# Patient Record
Sex: Male | Born: 1980 | Race: White | Hispanic: No | Marital: Married | Smoking: Never smoker
Health system: Southern US, Community
[De-identification: ages and names within clinical notes are randomized; demographics above are authoritative.]

## PROBLEM LIST (undated history)

## (undated) DIAGNOSIS — G510 Bell's palsy: Principal | ICD-10-CM

## (undated) HISTORY — DX: Bell's palsy: G51.0

## (undated) HISTORY — PX: WISDOM TOOTH EXTRACTION: SHX21

---

## 2013-11-06 ENCOUNTER — Emergency Department (HOSPITAL_BASED_OUTPATIENT_CLINIC_OR_DEPARTMENT_OTHER)
Admission: EM | Admit: 2013-11-06 | Discharge: 2013-11-06 | Disposition: A | Discharge status: Home / Self Care | Payer: BC Managed Care – PPO | Attending: Emergency Medicine | Admitting: Emergency Medicine

## 2013-11-06 ENCOUNTER — Encounter (HOSPITAL_BASED_OUTPATIENT_CLINIC_OR_DEPARTMENT_OTHER): Payer: Self-pay | Admitting: Emergency Medicine

## 2013-11-06 DIAGNOSIS — J3489 Other specified disorders of nose and nasal sinuses: Secondary | ICD-10-CM | POA: Insufficient documentation

## 2013-11-06 DIAGNOSIS — IMO0001 Reserved for inherently not codable concepts without codable children: Secondary | ICD-10-CM | POA: Insufficient documentation

## 2013-11-06 DIAGNOSIS — G510 Bell's palsy: Secondary | ICD-10-CM | POA: Insufficient documentation

## 2013-11-06 DIAGNOSIS — R209 Unspecified disturbances of skin sensation: Secondary | ICD-10-CM | POA: Insufficient documentation

## 2013-11-06 MED ORDER — PREDNISONE 10 MG PO TABS: ORAL_TABLET | ORAL | Status: DC

## 2013-11-06 MED ORDER — VALACYCLOVIR HCL 1 G PO TABS: 1000.0000 mg | ORAL_TABLET | Freq: Three times a day (TID) | ORAL | Status: DC

## 2013-11-06 MED ORDER — HYDROCODONE-ACETAMINOPHEN 5-325 MG PO TABS: 2.0000 | ORAL_TABLET | ORAL | Status: DC | PRN

## 2013-11-06 NOTE — ED Notes (Addendum)
Pt states he has right sided temporal headache for two days. Worse with dependant and laying down.  Pt has numbness to right side of face and tongue.  Has difficulty raising right eyebrow and closing right eye as well as dryness.  No other neuro symptoms noted.  Pt was seen at urgent care prior to coming here.

## 2013-11-06 NOTE — Discharge Instructions (Signed)
Take the medication as directed. Do not take the narcotic if you are driving as it will make you sleepy. Follow up with your doctor. Return here as needed.  Bell's Palsy Bell's palsy is a condition in which the muscles on one side of the face cannot move (paralysis). This is because the nerves in the face are paralyzed. It is most often thought to be caused by a virus. The virus causes swelling of the nerve that controls movement on one side of the face. The nerve travels through a tight space surrounded by bone. When the nerve swells, it can be compressed by the bone. This results in damage to the protective covering around the nerve. This damage interferes with how the nerve communicates with the muscles of the face. As a result, it can cause weakness or paralysis of the facial muscles.  Injury (trauma), tumor, and surgery may cause Bell's palsy, but most of the time the cause is unknown. It is a relatively common condition. It starts suddenly (abrupt onset) with the paralysis usually ending within 2 days. Bell's palsy is not dangerous. But because the eye does not close properly, you may need care to keep the eye from getting dry. This can include splinting (to keep the eye shut) or moistening with artificial tears. Bell's palsy very seldom occurs on both sides of the face at the same time. SYMPTOMS   Eyebrow sagging.  Drooping of the eyelid and corner of the mouth.  Inability to close one eye.  Loss of taste on the front of the tongue.  Sensitivity to loud noises. TREATMENT  The treatment is usually non-surgical. If the patient is seen within the first 24 to 48 hours, a short course of steroids may be prescribed, in an attempt to shorten the length of the condition. Antiviral medicines may also be used with the steroids, but it is unclear if they are helpful.  You will need to protect your eye, if you cannot close it. The cornea (clear covering over your eye) will become dry and can be damaged.  Artificial tears can be used to keep your eye moist. Glasses or an eye patch should be worn to protect your eye. PROGNOSIS  Recovery is variable, ranging from days to months. Although the problem usually goes away completely (about 80% of cases resolve), predicting the outcome is impossible. Most people improve within 3 weeks of when the symptoms began. Improvement may continue for 3 to 6 months. A small number of people have moderate to severe weakness that is permanent.  HOME CARE INSTRUCTIONS   If your caregiver prescribed medication to reduce swelling in the nerve, use as directed. Do not stop taking the medication unless directed by your caregiver.  Use moisturizing eye drops as needed to prevent drying of your eye, as directed by your caregiver.  Protect your eye, as directed by your caregiver.  Use facial massage and exercises, as directed by your caregiver.  Perform your normal activities, and get your normal rest. SEEK IMMEDIATE MEDICAL CARE IF:   There is pain, redness or irritation in the eye.  You or your child has an oral temperature above 102 F (38.9 C), not controlled by medicine. MAKE SURE YOU:   Understand these instructions.  Will watch your condition.  Will get help right away if you are not doing well or get worse. Document Released: 04/23/2005 Document Revised: 07/16/2011 Document Reviewed: 05/02/2009 [facility] Patient Information 2015 [place], [place]. This information is not intended to replace advice  given to you by your health care provider. Make sure you discuss any questions you have with your health care provider.

## 2013-11-06 NOTE — ED Provider Notes (Signed)
CSN: [id number]     Arrival date & time 11/06/13  1135 History   First MD Initiated Contact with Patient 11/06/13 1219     Chief Complaint  Patient presents with  . Headache  . Numbness     (Consider location/radiation/quality/duration/timing/severity/associated sxs/prior Treatment) Patient is a 33 y.o. male presenting with headaches. The history is provided by the patient.  Headache Pain location:  R temporal and R parietal Radiates to:  Face Severity currently:  6/10 Severity at highest:  10/10 Onset quality:  Gradual Duration:  2 days Timing:  Constant Chronicity:  New Similar to prior headaches: no   Relieved by:  Nothing Associated symptoms: congestion and myalgias   Associated symptoms: no abdominal pain, no back pain, no diarrhea, no fever, no nausea, no sinus pressure, no sore throat and no vomiting    Herbert SpiresScott Burich is a 33 y.o. male who presents to the ED with headache that started 2 days ago. He took OTC medications that helped some but never completely went away. The headache is located on the right side of his head. This morning he developed right side of face was numb and his eyebrows did not match. The right side is lower than the left and he can not close his right eye completely. The symptoms are limited to the right side of his face and felling different when he swallows. He rates his headache as a 6/10 which is less than it was 2 days ago when it was 10/10. Denies numbness but does have tingling in the right side of his mouth.   No past medical history on file. No past surgical history on file. No family history on file. History  Substance Use Topics  . Smoking status: Never Smoker   . Smokeless tobacco: Not on file  . Alcohol Use: Not on file    Review of Systems  Constitutional: Negative for fever and chills.  HENT: Positive for congestion. Negative for sinus pressure and sore throat.   Gastrointestinal: Negative for nausea, vomiting, abdominal pain and  diarrhea.  Genitourinary: Negative for dysuria, urgency and frequency.  Musculoskeletal: Positive for myalgias. Negative for back pain and gait problem.  Skin: Negative for rash.  Neurological: Positive for headaches. Negative for syncope.  Psychiatric/Behavioral: Negative for confusion. The patient is not nervous/anxious.       Allergies  Review of patient's allergies indicates no known allergies.  Home Medications   Prior to Admission medications   Not on File   BP 124/69  Pulse 61  Temp(Src) 98.4 F (36.9 C)  Resp 16  Ht 6' (1.829 m)  Wt 200 lb (90.719 kg)  BMI 27.12 kg/m2  SpO2 100% Physical Exam  Constitutional: He is oriented to person, place, and time. He appears well-developed and well-nourished. No distress.  HENT:  Head: Normocephalic and atraumatic.  Right Ear: Tympanic membrane normal.  Left Ear: Tympanic membrane normal.  Nose: Nose normal.  Mouth/Throat: Uvula is midline, oropharynx is clear and moist and mucous membranes are normal.  Eyes: Conjunctivae and EOM are normal. Pupils are equal, round, and reactive to light.  Neck: Normal range of motion. Neck supple.  Cardiovascular: Normal rate, regular rhythm and normal heart sounds.   Pulmonary/Chest: Effort normal. He has no wheezes. He has no rales.  Musculoskeletal: Normal range of motion. He exhibits no edema.  Radial and pedal pulses strong, adequate circulation, good touch sensation.  Neurological: He is alert and oriented to person, place, and time. 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 He displays a negative Romberg sign. Gait normal.  Reflex Scores:      Bicep reflexes are 2+ on the right side and 2+ on the left side.      Brachioradialis reflexes are 2+ on the right side and 2+ on the left side.      Patellar reflexes are 2+ on the right side and 2+ on the left side.      Achilles reflexes are 2+ on the right side and 2+ on the left side. Rapid alternating movement  without difficulty. Stands on one foot without difficulty. Unable to closed right eye completely, right eyebrow lower than left. Slight droop of right side of mouth.   Skin: Skin is warm and dry.  Psychiatric: He has a normal mood and affect. His behavior is normal.    ED Course  Procedures   MDM  33 y.o. male with right side facial droop. Stable for discharge without concern for CVA at this time. Will treat for Bell's Palsy. Discussed with the patient and all questioned fully answered. He will return if any problems arise.    Medication List         HYDROcodone-acetaminophen 5-325 MG per tablet  Commonly known as:  NORCO/VICODIN  Take 2 tablets by mouth every 4 (four) hours as needed.     predniSONE 10 MG tablet  Commonly known as:  DELTASONE  Take 6 tablets today then 5, 4, 3, 2, 1     valACYclovir 1000 MG tablet  Commonly known as:  VALTREX  Take 1 tablet (1,000 mg total) by mouth 3 (three) times daily.           [facility] Orlene OchM Vandy Fong, [place]NP 11/07/13 [phone]

## 2013-11-08 NOTE — ED Provider Notes (Signed)
Medical screening examination/treatment/procedure(s) were performed by non-physician practitioner and as supervising physician I was immediately available for consultation/collaboration.   EKG Interpretation None        Joshua SproutWhitney Jojuan Champney, MD 11/08/13 1158

## 2013-11-17 ENCOUNTER — Ambulatory Visit (INDEPENDENT_AMBULATORY_CARE_PROVIDER_SITE_OTHER): Payer: BC Managed Care – PPO | Admitting: Neurology

## 2013-11-17 ENCOUNTER — Encounter: Payer: Self-pay | Admitting: Neurology

## 2013-11-17 VITALS — BP 127/60 | HR 67 | Ht 72.0 in | Wt 201.0 lb

## 2013-11-17 DIAGNOSIS — G510 Bell's palsy: Secondary | ICD-10-CM

## 2013-11-17 HISTORY — DX: Bell's palsy: G51.0

## 2013-11-17 NOTE — Progress Notes (Signed)
Reason for visit: Bell's palsy  Joshua SpiresScott Underwood is a 33 y.o. male  History of present illness:  Joshua Underwood is a 33 year old right-handed white male with a history of onset of Bell's palsy on 11/06/2013. The patient reported onset of headache 2 days prior that involved the right occipital area and around the right ear. The patient also has some discomfort going down into the right neck. The patient was seen through the emergency room and he was placed on a prednisone taper and Valtrex. The prednisone seemed to help the headache, but as the taper off of prednisone continued, the headache ensued, and his right facial weakness got worse. The patient was seen by an ENT physician, and he was placed back on a prednisone taper, and this once again his helped the headache. The patient has severe right facial weakness at this point. He is having to tape his eyelid shut at night, and he uses artificial tears during the day. The patient denies any numbness or weakness on the arms or legs. There is no numbness on the face. The patient denies any double vision or loss of vision, but his right eye is somewhat blurry with the eyedrops. He does have alteration in taste with a metallic taste on the right side of tongue. He has hyperacusis with the right ear. He denies any balance issues. He comes to this office for an evaluation.  Past Medical History  Diagnosis Date  . Bell's palsy 11/17/2013    right    History reviewed. No pertinent past surgical history.  Family History  Problem Relation Age of Onset  . Diabetes Maternal Grandmother   . Kidney disease Maternal Grandfather   . Diabetes Paternal Grandmother     Social history:  reports that he has never smoked. He has never used smokeless tobacco. He reports that he does not drink alcohol or use illicit drugs.  Medications:  Current Outpatient Prescriptions on File Prior to Visit  Medication Sig Dispense Refill  . HYDROcodone-acetaminophen  (NORCO/VICODIN) 5-325 MG per tablet Take 2 tablets by mouth every 4 (four) hours as needed.  10 tablet  0   No current facility-administered medications on file prior to visit.     No Known Allergies  ROS:  Out of a complete 14 system review of symptoms, the patient complains only of the following symptoms, and all other reviewed systems are negative.  Fatigue Ringing in the ears Blurred vision, eye pain Headache, numbness, weakness Decreased energy  Blood pressure 127/60, pulse 67, height 6' (1.829 m), weight 201 lb (91.173 kg).  Physical Exam  General: The patient is alert and cooperative at the time of the examination.  Eyes: Pupils are equal, round, and reactive to light. Discs are flat bilaterally.  Neck: The neck is supple, no carotid bruits are noted.  Respiratory: The respiratory examination is clear.  Cardiovascular: The cardiovascular examination reveals a regular rate and rhythm, no obvious murmurs or rubs are noted.  Skin: Extremities are without significant edema.  Neurologic Exam  Mental status: The patient is alert and oriented x 3 at the time of the examination. The patient has apparent normal recent and remote memory, with an apparently normal attention span and concentration ability.  Cranial nerves: Facial symmetry is not present. The patient has severe weakness of the right side of face and a peripheral distribution. There is good sensation of the face to pinprick and soft touch bilaterally. The strength of the muscles to head turning and shoulder  shrug are normal bilaterally. Speech is well enunciated, no aphasia or dysarthria is noted. Extraocular movements are full. Visual fields are full. The tongue is midline, and the patient has symmetric elevation of the soft palate. No obvious hearing deficits are noted.  Motor: The motor testing reveals 5 over 5 strength of all 4 extremities. Good symmetric motor tone is noted throughout.  Sensory: Sensory testing  is intact to pinprick, soft touch, vibration sensation, and position sense on all 4 extremities. No evidence of extinction is noted.  Coordination: Cerebellar testing reveals good finger-nose-finger and heel-to-shin bilaterally.  Gait and station: Gait is normal. Tandem gait is normal. Romberg is negative. No drift is seen.  Reflexes: Deep tendon reflexes are symmetric and normal bilaterally. Toes are downgoing bilaterally.   Assessment/Plan:  1. 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 The patient will need to protect the corneal on the right. The patient will continue the prednisone taper, and he can go on nonsteroidal anti-inflammatory medication such as Motrin after this if he still has some discomfort. The patient will followup in 2 weeks. The patient has a severe Bell's palsy, and it may take 4 months or more for recovery to occur.  Marlan Palau MD 11/17/2013 9:00 PM  [facility] [address] [place], [place] [address]  Phone [phone] Fax [phone]

## 2013-11-17 NOTE — Patient Instructions (Signed)
Bell's Palsy °Bell's palsy is a condition in which the muscles on one side of the face cannot move (paralysis). This is because the nerves in the face are paralyzed. It is most often thought to be caused by a virus. The virus causes swelling of the nerve that controls movement on one side of the face. The nerve travels through a tight space surrounded by bone. When the nerve swells, it can be compressed by the bone. This results in damage to the protective covering around the nerve. This damage interferes with how the nerve communicates with the muscles of the face. As a result, it can cause weakness or paralysis of the facial muscles.  °Injury (trauma), tumor, and surgery may cause Bell's palsy, but most of the time the cause is unknown. It is a relatively common condition. It starts suddenly (abrupt onset) with the paralysis usually ending within 2 days. Bell's palsy is not dangerous. But because the eye does not close properly, you may need care to keep the eye from getting dry. This can include splinting (to keep the eye shut) or moistening with artificial tears. Bell's palsy very seldom occurs on both sides of the face at the same time. °SYMPTOMS  °· Eyebrow sagging. °· Drooping of the eyelid and corner of the mouth. °· Inability to close one eye. °· Loss of taste on the front of the tongue. °· Sensitivity to loud noises. °TREATMENT  °The treatment is usually non-surgical. If the patient is seen within the first 24 to 48 hours, a short course of steroids may be prescribed, in an attempt to shorten the length of the condition. Antiviral medicines may also be used with the steroids, but it is unclear if they are helpful.  °You will need to protect your eye, if you cannot close it. The cornea (clear covering over your eye) will become dry and can be damaged. Artificial tears can be used to keep your eye moist. Glasses or an eye patch should be worn to protect your eye. °PROGNOSIS  °Recovery is variable, ranging  from days to months. Although the problem usually goes away completely (about 80% of cases resolve), predicting the outcome is impossible. Most people improve within 3 weeks of when the symptoms began. Improvement may continue for 3 to 6 months. A small number of people have moderate to severe weakness that is permanent.  °HOME CARE INSTRUCTIONS  °· If your caregiver prescribed medication to reduce swelling in the nerve, use as directed. Do not stop taking the medication unless directed by your caregiver. °· Use moisturizing eye drops as needed to prevent drying of your eye, as directed by your caregiver. °· Protect your eye, as directed by your caregiver. °· Use facial massage and exercises, as directed by your caregiver. °· Perform your normal activities, and get your normal rest. °SEEK IMMEDIATE MEDICAL CARE IF:  °· There is pain, redness or irritation in the eye. °· You or your child has an oral temperature above 102° F (38.9° C), not controlled by medicine. °MAKE SURE YOU:  °· Understand these instructions. °· Will watch your condition. °· Will get help right away if you are not doing well or get worse. °Document Released: 04/23/2005 Document Revised: 07/16/2011 Document Reviewed: 05/02/2009 °ExitCare® Patient Information ©2015 [employer]. This information is not intended to replace advice given to you by your health care provider. Make sure you discuss any questions you have with your health care provider. ° °

## 2014-01-19 ENCOUNTER — Encounter: Payer: Self-pay | Admitting: Adult Health

## 2014-01-19 ENCOUNTER — Ambulatory Visit (INDEPENDENT_AMBULATORY_CARE_PROVIDER_SITE_OTHER): Payer: BC Managed Care – PPO | Admitting: Adult Health

## 2014-01-19 VITALS — BP 125/69 | HR 53 | Ht 74.0 in | Wt 201.0 lb

## 2014-01-19 DIAGNOSIS — G510 Bell's palsy: Secondary | ICD-10-CM

## 2014-01-19 NOTE — Patient Instructions (Signed)
Bell's Palsy °Bell's palsy is a condition in which the muscles on one side of the face cannot move (paralysis). This is because the nerves in the face are paralyzed. It is most often thought to be caused by a virus. The virus causes swelling of the nerve that controls movement on one side of the face. The nerve travels through a tight space surrounded by bone. When the nerve swells, it can be compressed by the bone. This results in damage to the protective covering around the nerve. This damage interferes with how the nerve communicates with the muscles of the face. As a result, it can cause weakness or paralysis of the facial muscles.  °Injury (trauma), tumor, and surgery may cause Bell's palsy, but most of the time the cause is unknown. It is a relatively common condition. It starts suddenly (abrupt onset) with the paralysis usually ending within 2 days. Bell's palsy is not dangerous. But because the eye does not close properly, you may need care to keep the eye from getting dry. This can include splinting (to keep the eye shut) or moistening with artificial tears. Bell's palsy very seldom occurs on both sides of the face at the same time. °SYMPTOMS  °· Eyebrow sagging. °· Drooping of the eyelid and corner of the mouth. °· Inability to close one eye. °· Loss of taste on the front of the tongue. °· Sensitivity to loud noises. °TREATMENT  °The treatment is usually non-surgical. If the patient is seen within the first 24 to 48 hours, a short course of steroids may be prescribed, in an attempt to shorten the length of the condition. Antiviral medicines may also be used with the steroids, but it is unclear if they are helpful.  °You will need to protect your eye, if you cannot close it. The cornea (clear covering over your eye) will become dry and can be damaged. Artificial tears can be used to keep your eye moist. Glasses or an eye patch should be worn to protect your eye. °PROGNOSIS  °Recovery is variable, ranging  from days to months. Although the problem usually goes away completely (about 80% of cases resolve), predicting the outcome is impossible. Most people improve within 3 weeks of when the symptoms began. Improvement may continue for 3 to 6 months. A small number of people have moderate to severe weakness that is permanent.  °HOME CARE INSTRUCTIONS  °· If your caregiver prescribed medication to reduce swelling in the nerve, use as directed. Do not stop taking the medication unless directed by your caregiver. °· Use moisturizing eye drops as needed to prevent drying of your eye, as directed by your caregiver. °· Protect your eye, as directed by your caregiver. °· Use facial massage and exercises, as directed by your caregiver. °· Perform your normal activities, and get your normal rest. °SEEK IMMEDIATE MEDICAL CARE IF:  °· There is pain, redness or irritation in the eye. °· You or your child has an oral temperature above 102° F (38.9° C), not controlled by medicine. °MAKE SURE YOU:  °· Understand these instructions. °· Will watch your condition. °· Will get help right away if you are not doing well or get worse. °Document Released: 04/23/2005 Document Revised: 07/16/2011 Document Reviewed: 07/31/2013 °ExitCare® Patient Information ©2015 [employer], LLC. This information is not intended to replace advice given to you by your health care provider. Make sure you discuss any questions you have with your health care provider. ° °

## 2014-01-19 NOTE — Progress Notes (Signed)
PATIENT: Joshua Underwood DOB: 1980/12/22  REASON FOR VISIT: follow up HISTORY FROM: patient  HISTORY OF PRESENT ILLNESS: Joshua Underwood is a 33 year old male with a history of Bell's palsy. He returns today for follow-up. He states that it has gotten better but not resolved yet. He states that he noticed several weeks ago, he begin to get some movement back at the mouth and upper cheek. He continues to not be able to close his eyes completely. So he has to tape his eye shut. He is also unable to raise his eyebrow on the left. He states that he initially had severe pain but after taking the prednisone that has resolved. He continues to do facial exercises.    HISTORY 11/17/13 (CW): 33 year old right-handed white male with a history of onset of Bell's palsy on 11/06/2013. The patient reported onset of headache 2 days prior that involved the right occipital area and around the right ear. The patient also has some discomfort going down into the right neck. The patient was seen through the emergency room and he was placed on a prednisone taper and Valtrex. The prednisone seemed to help the headache, but as the taper off of prednisone continued, the headache ensued, and his right facial weakness got worse. The patient was seen by an ENT physician, and he was placed back on a prednisone taper, and this once again his helped the headache. The patient has severe right facial weakness at this point. He is having to tape his eyelid shut at night, and he uses artificial tears during the day. The patient denies any numbness or weakness on the arms or legs. There is no numbness on the face. The patient denies any double vision or loss of vision, but his right eye is somewhat blurry with the eyedrops. He does have alteration in taste with a metallic taste on the right side of tongue. He has hyperacusis with the right ear. He denies any balance issues. He comes to this office for an evaluation.   REVIEW OF SYSTEMS: Full  14 system review of systems performed and notable only for:  Constitutional: N/A  Eyes: light sensitivity, blurred vision Ear/Nose/Throat: ear pain Skin: N/A  Cardiovascular: N/A  Respiratory: N/A  Gastrointestinal: N/A  Genitourinary: N/A Hematology/Lymphatic: N/A  Endocrine: N/A Musculoskeletal:N/A  Allergy/Immunology: N/A  Neurological: facial drooping Psychiatric: N/A Sleep: N/A   ALLERGIES: No Known Allergies  HOME MEDICATIONS: Outpatient Prescriptions Prior to Visit  Medication Sig Dispense Refill  . HYDROcodone-acetaminophen (NORCO/VICODIN) 5-325 MG per tablet Take 2 tablets by mouth every 4 (four) hours as needed.  10 tablet  0  . predniSONE (DELTASONE) 20 MG tablet Take 20 mg by mouth daily with breakfast. 60 mg for 7 days 40 mg for 3 days 20 mg for 3 days       No facility-administered medications prior to visit.    PAST MEDICAL HISTORY: Past Medical History  Diagnosis Date  . Bell's palsy 11/17/2013    right    PAST SURGICAL HISTORY: Past Surgical History  Procedure Laterality Date  . None      FAMILY HISTORY: Family History  Problem Relation Age of Onset  . Diabetes Maternal Grandmother   . Kidney disease Maternal Grandfather   . Diabetes Paternal Grandmother     SOCIAL HISTORY: History   Social History  . Marital Status: Married    Spouse Name: N/A    Number of Children: 0  . 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 Smoking status: Never Smoker   . Smokeless tobacco: Never Used  . Alcohol Use: No  . Drug Use: No  . Sexual Activity: Not on file   Other Topics Concern  . Not on file   Social History Narrative   Patient is married, no children.   Patient is right handed.   Patient has college education.   Patient drinks 2 sodas a week.      PHYSICAL EXAM  Filed Vitals:   01/19/14 0951  BP: 125/69  Pulse: 53  Height:  (1.88 m)    Weight: 201 lb (91.173 kg)   Body mass index is 25.8 kg/(m^2).  Generalized: Well developed, in no acute distress   Neurological examination  Mentation: Alert oriented to time, place, history taking. Follows all commands speech and language fluent Cranial nerve II-XII: Pupils were equal round reactive to light. Extraocular movements were full, visual field were full on confrontational test. Facial sensation was normal. Continues to have moderate weakness on the right side of face. Unable to raise eyebrow on the right, can close his eye about 80%. He can slightly raise his cheek/smile on the right .Head turning and shoulder shrug  were normal and symmetric. Motor: The motor testing reveals 5 over 5 strength of all 4 extremities. Good symmetric motor tone is noted throughout.  Sensory: Sensory testing is intact to soft touch on all 4 extremities. No evidence of extinction is noted.  Coordination: Cerebellar testing reveals good finger-nose-finger and heel-to-shin bilaterally.  Gait and station: Gait is normal. Tandem gait is normal. Romberg is negative. No drift is seen.  Reflexes: Deep tendon reflexes are symmetric and normal bilaterally.    DIAGNOSTIC DATA (LABS, IMAGING, TESTING) - I reviewed patient records, labs, notes, testing and imaging myself where available.   ASSESSMENT AND PLAN 33 y.o. year old male  has a past medical history of Bell's palsy (11/17/2013). here with:  1. Bell's palsy  Patient should continue with facial exercises Let us know if his symptoms worsen or new symptoms develop. Follow-up in 4 months or sooner if needed.   Butch Penny, MSN, NP-C 01/19/2014, 10:15 AM [facility] [address], [address] [place], [place] [address] [phone]  Note: This document was prepared with digital dictation and possible smart phrase technology. Any transcriptional errors that result from this process are unintentional.

## 2014-01-19 NOTE — Progress Notes (Signed)
I have read the note, and I agree with the clinical assessment and plan.  Sieara Bremer KEITH   

## 2014-05-19 ENCOUNTER — Ambulatory Visit: Payer: Self-pay | Admitting: Neurology

## 2014-05-19 ENCOUNTER — Ambulatory Visit (INDEPENDENT_AMBULATORY_CARE_PROVIDER_SITE_OTHER): Payer: BLUE CROSS/BLUE SHIELD | Admitting: Adult Health

## 2014-05-19 ENCOUNTER — Encounter: Payer: Self-pay | Admitting: Adult Health

## 2014-05-19 VITALS — BP 116/72 | HR 74 | Ht 71.0 in | Wt 200.0 lb

## 2014-05-19 DIAGNOSIS — G510 Bell's palsy: Secondary | ICD-10-CM

## 2014-05-19 NOTE — Patient Instructions (Signed)
Bell's Palsy °Bell's palsy is a condition in which the muscles on one side of the face cannot move (paralysis). This is because the nerves in the face are paralyzed. It is most often thought to be caused by a virus. The virus causes swelling of the nerve that controls movement on one side of the face. The nerve travels through a tight space surrounded by bone. When the nerve swells, it can be compressed by the bone. This results in damage to the protective covering around the nerve. This damage interferes with how the nerve communicates with the muscles of the face. As a result, it can cause weakness or paralysis of the facial muscles.  °Injury (trauma), tumor, and surgery may cause Bell's palsy, but most of the time the cause is unknown. It is a relatively common condition. It starts suddenly (abrupt onset) with the paralysis usually ending within 2 days. Bell's palsy is not dangerous. But because the eye does not close properly, you may need care to keep the eye from getting dry. This can include splinting (to keep the eye shut) or moistening with artificial tears. Bell's palsy very seldom occurs on both sides of the face at the same time. °SYMPTOMS  °· Eyebrow sagging. °· Drooping of the eyelid and corner of the mouth. °· Inability to close one eye. °· Loss of taste on the front of the tongue. °· Sensitivity to loud noises. °TREATMENT  °The treatment is usually non-surgical. If the patient is seen within the first 24 to 48 hours, a short course of steroids may be prescribed, in an attempt to shorten the length of the condition. Antiviral medicines may also be used with the steroids, but it is unclear if they are helpful.  °You will need to protect your eye, if you cannot close it. The cornea (clear covering over your eye) will become dry and can be damaged. Artificial tears can be used to keep your eye moist. Glasses or an eye patch should be worn to protect your eye. °PROGNOSIS  °Recovery is variable, ranging  from days to months. Although the problem usually goes away completely (about 80% of cases resolve), predicting the outcome is impossible. Most people improve within 3 weeks of when the symptoms began. Improvement may continue for 3 to 6 months. A small number of people have moderate to severe weakness that is permanent.  °HOME CARE INSTRUCTIONS  °· If your caregiver prescribed medication to reduce swelling in the nerve, use as directed. Do not stop taking the medication unless directed by your caregiver. °· Use moisturizing eye drops as needed to prevent drying of your eye, as directed by your caregiver. °· Protect your eye, as directed by your caregiver. °· Use facial massage and exercises, as directed by your caregiver. °· Perform your normal activities, and get your normal rest. °SEEK IMMEDIATE MEDICAL CARE IF:  °· There is pain, redness or irritation in the eye. °· You or your child has an oral temperature above 102° F (38.9° C), not controlled by medicine. °MAKE SURE YOU:  °· Understand these instructions. °· Will watch your condition. °· Will get help right away if you are not doing well or get worse. °Document Released: 04/23/2005 Document Revised: 07/16/2011 Document Reviewed: 07/31/2013 °ExitCare® Patient Information ©2015 [employer], LLC. This information is not intended to replace advice given to you by your health care provider. Make sure you discuss any questions you have with your health care provider. ° °

## 2014-05-19 NOTE — Progress Notes (Signed)
I have read the note, and I agree with the clinical assessment and plan.  Joshua Underwood,Joshua Underwood   

## 2014-05-19 NOTE — Progress Notes (Signed)
PATIENT: Joshua Underwood DOB: 1980-10-29  REASON FOR VISIT: follow up- Bell's palsy HISTORY FROM: patient  HISTORY OF PRESENT ILLNESS: Mr. Joshua Underwood is a 34 year old male with a history of bell's palsy. He returns today for follow-up. He states that he has returned to about 75% of his baseline. He is able to close the eye on the right now. No longer has to tape his eye shut at night. He is able to raise the eye brow about 25% on the right. Still has a hard time puckering his lips on the right. He can is able to smile however he cannot shut the lips completely on the right. Therefore when he is eating food or liquid may spill out. The patient has learned to cope with this. The tenderness he felt in the neck on the right beside the esophagus has improved. Overall the patient is that he has improved since the last visit.  HISTORY 01/19/14:  Mr. Joshua Underwood is a 34 year old male with a history of Bell's palsy. He returns today for follow-up. He states that it has gotten better but not resolved yet. He states that he noticed several weeks ago, he begin to get some movement back at the mouth and upper cheek. He continues to not be able to close his eyes completely. So he has to tape his eye shut. He is also unable to raise his eyebrow on the left. He states that he initially had severe pain but after taking the prednisone that has resolved. He continues to do facial exercises.   HISTORY 11/17/13 (CW): 34 year old right-handed white male with a history of onset of Bell's palsy on 11/06/2013. The patient reported onset of headache 2 days prior that involved the right occipital area and around the right ear. The patient also has some discomfort going down into the right neck. The patient was seen through the emergency room and he was placed on a prednisone taper and Valtrex. The prednisone seemed to help the headache, but as the taper off of prednisone continued, the headache ensued, and his right facial weakness got  worse. The patient was seen by an ENT physician, and he was placed back on a prednisone taper, and this once again his helped the headache. The patient has severe right facial weakness at this point. He is having to tape his eyelid shut at night, and he uses artificial tears during the day. The patient denies any numbness or weakness on the arms or legs. There is no numbness on the face. The patient denies any double vision or loss of vision, but his right eye is somewhat blurry with the eyedrops. He does have alteration in taste with a metallic taste on the right side of tongue. He has hyperacusis with the right ear. He denies any balance issues. He comes to this office for an evaluation.  REVIEW OF SYSTEMS: Out of a complete 14 system review of symptoms, the patient complains only of the following symptoms, and all other reviewed systems are negative. See HPI  ALLERGIES: No Known Allergies  HOME MEDICATIONS: Outpatient Prescriptions Prior to Visit  Medication Sig Dispense Refill  . b complex vitamins tablet Take 1 tablet by mouth daily.    . Multiple Vitamins-Minerals (MULTIVITAMIN WITH MINERALS) tablet Take 1 tablet by mouth daily.     No facility-administered medications prior to visit.    PAST MEDICAL HISTORY: Past Medical History  Diagnosis Date  . Bell's palsy 11/17/2013    right  PAST SURGICAL HISTORY: Past Surgical History  Procedure Laterality Date  . None      FAMILY HISTORY: Family History  Problem Relation Age of Onset  . Diabetes Maternal Grandmother   . Kidney disease Maternal Grandfather   . Diabetes Paternal Grandmother     SOCIAL HISTORY: History   Social History  . Marital Status: Married    Spouse Name: N/A    Number of Children: 0  . 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 Smoking status: Never Smoker   . Smokeless tobacco: Never Used  . Alcohol Use: No  .  Drug Use: No  . Sexual Activity: Not on file   Other Topics Concern  . Not on file   Social History Narrative   Patient is married, no children.   Patient is right handed.   Patient has college education.   Patient drinks 2 sodas a week.      PHYSICAL EXAM  Filed Vitals:   05/19/14 1538  BP: 116/72  Pulse: 74  Height: 5\' 11"  (1.803 m)  Weight: 200 lb (90.719 kg)  -  Body mass index is 27.91 kg/(m^2).  Neurological examination  Mentation: Alert oriented to time, place, history taking. Follows all commands speech and language fluent Cranial nerve II-XII: Pupils were equal round reactive to light. Extraocular movements were full, visual field were full on confrontational test. Facial sensation was normal. He is able to smile on the right but is unable to complety close his lips on the right. He is able to raise his eyebrow percent on the right and can now completely should his on the right. .Head turning and shoulder shrug were normal and symmetric. Motor: The motor testing reveals 5 over 5 strength of all 4 extremities. Good symmetric motor tone is noted throughout.  Sensory: Sensory testing is intact to soft touch on all 4 extremities. No evidence of extinction is noted.  Coordination: Cerebellar testing reveals good finger-nose-finger and heel-to-shin bilaterally.  Gait and station: Gait is normal. Reflexes: Deep tendon reflexes are symmetric and normal bilaterally.   DIAGNOSTIC DATA (LABS, IMAGING, TESTING) - I reviewed patient records, labs, notes, testing and imaging myself where available.  ASSESSMENT AND PLAN 34 y.o. year old male  has a past medical history of Bell's palsy (11/17/2013). here with:  1. Bell's palsy  Patient should continue with facial exercises Let us know if his symptoms worsen or new symptoms develop. Follow-up in 6 months or sooner if needed.    [name], MSN, NP-C 05/19/2014, 3:36 PM [facility] [address],  [address] [place], [place] [id number] [phone]  Note: This document was prepared with digital dictation and possible smart phrase technology. Any transcriptional errors that result from this process are unintentional.

## 2014-05-21 ENCOUNTER — Ambulatory Visit: Payer: BC Managed Care – PPO | Admitting: Adult Health

## 2014-05-26 ENCOUNTER — Telehealth: Payer: Self-pay | Admitting: Adult Health

## 2014-05-26 NOTE — Telephone Encounter (Signed)
Pt states he has noticed some twitching in his nose and he wants to know if this is part of the healing process.  Please call and advise.

## 2014-05-27 NOTE — Telephone Encounter (Signed)
Can you tell me yes or no or why this could happen and I will follow up with him

## 2014-05-27 NOTE — Telephone Encounter (Signed)
I returned the patient's call and left a message for him to call the office.

## 2014-06-08 ENCOUNTER — Telehealth: Payer: Self-pay | Admitting: *Deleted

## 2014-06-08 NOTE — Telephone Encounter (Signed)
Tried calling to see if resolved no answer

## 2014-11-17 ENCOUNTER — Encounter: Payer: Self-pay | Admitting: Adult Health

## 2014-11-17 ENCOUNTER — Ambulatory Visit (INDEPENDENT_AMBULATORY_CARE_PROVIDER_SITE_OTHER): Payer: BLUE CROSS/BLUE SHIELD | Admitting: Adult Health

## 2014-11-17 VITALS — BP 118/73 | HR 76 | Ht 71.0 in | Wt 193.0 lb

## 2014-11-17 DIAGNOSIS — G510 Bell's palsy: Secondary | ICD-10-CM | POA: Diagnosis not present

## 2014-11-17 NOTE — Progress Notes (Signed)
PATIENT: Joshua SpiresScott Zimny DOB: 1981-03-08  REASON FOR VISIT: follow up- Bell's palsy HISTORY FROM: patient  HISTORY OF PRESENT ILLNESS: Mr. Joshua Underwood is a 34 year old male with a history of bells palsy. He returns today for follow-up. The patient feels that he is returned to about 90% of his baseline. He states that he is able to close his right eye now and raise his eyebrow. He still has a hard time shutting the mouth completely on the right side. He cannot pucker out his lips on the right side. He continues to do the facial exercises. He has noticed that when he eats his right eye will tear. He also notices that when he raises his right eyebrow the corner of the right side of the lip will raise as well. It is an approximate 1 year since his initial symptoms. He denies any new neurological symptoms. He returns today for an evaluation.   HISTORY 05/19/14: Mr. Joshua Underwood is a 34 year old male with a history of bell's palsy. He returns today for follow-up. He states that he has returned to about 75% of his baseline. He is able to close the eye on the right now. No longer has to tape his eye shut at night. He is able to raise the eye brow about 25% on the right. Still has a hard time puckering his lips on the right. He can is able to smile however he cannot shut the lips completely on the right. Therefore when he is eating food or liquid may spill out. The patient has learned to cope with this. The tenderness he felt in the neck on the right beside the esophagus has improved. Overall the patient is that he has improved since the last visit.  REVIEW OF SYSTEMS: Out of a complete 14 system review of symptoms, the patient complains only of the following symptoms, and all other reviewed systems are negative.  Facial drooping, weakness  ALLERGIES: No Known Allergies  HOME MEDICATIONS: Outpatient Prescriptions Prior to Visit  Medication Sig Dispense Refill  . b complex vitamins tablet Take 1 tablet by mouth  daily.    . Multiple Vitamins-Minerals (MULTIVITAMIN WITH MINERALS) tablet Take 1 tablet by mouth daily.     No facility-administered medications prior to visit.    PAST MEDICAL HISTORY: Past Medical History  Diagnosis Date  . Bell's palsy 11/17/2013    right    PAST SURGICAL HISTORY: Past Surgical History  Procedure Laterality Date  . None      FAMILY HISTORY: Family History  Problem Relation Age of Onset  . Diabetes Maternal Grandmother   . Kidney disease Maternal Grandfather   . Diabetes Paternal Grandmother     SOCIAL HISTORY: History   Social History  . Marital Status: Married    Spouse Name: N/A  . Number of Children: 0  . 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 Smoking status: Never Smoker   . Smokeless tobacco: Never Used  . Alcohol Use: No  . Drug Use: No  . Sexual Activity: Not on file   Other Topics Concern  . Not on file   Social History Narrative   Patient is married, no children.   Patient is right handed.   Patient has college education.   Patient drinks 2 sodas a week.      PHYSICAL EXAM  Filed Vitals:   11/17/14 1550  BP: 118/73  Pulse: 76  Height:  (1.803 m)  Weight: 193 lb (87.544 kg)   Body mass index is 26.93 kg/(m^2).  Generalized: Well developed, in no acute distress   Neurological examination  Mentation: Alert oriented to time, place, history taking. Follows all commands speech and language fluent Cranial nerve II-XII: Pupils were equal round reactive to light. Extraocular movements were full, visual field were full on confrontational test. Facial sensation. Uvula tongue midline. Head turning and shoulder shrug  were normal and symmetric. unable to close the mouth on the right side completely. He is able to raise the eyebrows on both sides. Patient has a mild droop on the right when smiling. Motor: The motor testing reveals 5 over 5  strength of all 4 extremities. Good symmetric motor tone is noted throughout.  Sensory: Sensory testing is intact to soft touch on all 4 extremities. No evidence of extinction is noted.  Coordination: Cerebellar testing reveals good finger-nose-finger and heel-to-shin bilaterally.  Gait and station: Gait is normal. Reflexes: Deep tendon reflexes are symmetric and normal bilaterally.   DIAGNOSTIC DATA (LABS, IMAGING, TESTING) - I reviewed patient records, labs, notes, testing and imaging myself where available.   ASSESSMENT AND PLAN 34 y.o. year old male  has a past medical history of Bell's palsy (11/17/2013). here with:  1. Bell's palsy   The patient's symptoms have improved. I've encouraged patient to continue doing the facial exercises. Advised patient that if his symptoms worsen or he develops new symptoms he should let us know. Otherwise he'll follow-up on an as-needed basis.   Butch Penny, MSN, NP-C 11/17/2014, 4:16 PM [facility] [address] [place], [place] [address] [phone]  Note: This document was prepared with digital dictation and possible smart phrase technology. Any transcriptional errors that result from this process are unintentional.

## 2014-11-17 NOTE — Progress Notes (Signed)
I have read the note, and I agree with the clinical assessment and plan.  WILLIS,CHARLES KEITH   

## 2015-04-19 ENCOUNTER — Telehealth: Payer: Self-pay | Admitting: Adult Health

## 2015-04-19 NOTE — Telephone Encounter (Signed)
Patient is calling and has questions [facility] and possible treatment for twitching. Please call.

## 2015-04-19 NOTE — Telephone Encounter (Signed)
Spoke to patient. Scheduled appt

## 2015-04-20 ENCOUNTER — Ambulatory Visit (INDEPENDENT_AMBULATORY_CARE_PROVIDER_SITE_OTHER): Payer: BLUE CROSS/BLUE SHIELD | Admitting: Adult Health

## 2015-04-20 ENCOUNTER — Encounter: Payer: Self-pay | Admitting: Adult Health

## 2015-04-20 VITALS — BP 106/67 | HR 72 | Ht 72.0 in | Wt 186.0 lb

## 2015-04-20 DIAGNOSIS — G510 Bell's palsy: Secondary | ICD-10-CM

## 2015-04-20 NOTE — Progress Notes (Signed)
PATIENT: Joshua Underwood DOB: 1980/05/11  REASON FOR VISIT: follow up- bells palsy HISTORY FROM: patient  HISTORY OF PRESENT ILLNESS: Mr. [name] is a 34 year old male with a history of Bell's palsy. He returns stating discuss Botox treatment. It has been over one year since his episode of Bell's palsy. The patient has recovered quite well. However he is concerned about the symmetry of his face. He states that his right eyebrow does not raise consistently with the left eyebrow. And he has a hard time closing the right upper lip. The patient has read online about Botox treatment. He returns today to discuss this as an option for him. He denies any new neurological symptoms. He returns today for an evaluation.  HISTORY 11/17/14: Mr. [name] is a 34 year old male with a history of bells palsy. He returns today for follow-up. The patient feels that he is returned to about 90% of his baseline. He states that he is able to close his right eye now and raise his eyebrow. He still has a hard time shutting the mouth completely on the right side. He cannot pucker out his lips on the right side. He continues to do the facial exercises. He has noticed that when he eats his right eye will tear. He also notices that when he raises his right eyebrow the corner of the right side of the lip will raise as well. It is an approximate 1 year since his initial symptoms. He denies any new neurological symptoms. He returns today for an evaluation.   HISTORY 05/19/14: Mr. Serano is a 34 year old male with a history of bell's palsy. He returns today for follow-up. He states that he has returned to about 75% of his baseline. He is able to close the eye on the right now. No longer has to tape his eye shut at night. He is able to raise the eye brow about 25% on the right. Still has a hard time puckering his lips on the right. He can is able to smile however he cannot shut the lips completely on the right. Therefore when he is eating  food or liquid may spill out. The patient has learned to cope with this. The tenderness he felt in the neck on the right beside the esophagus has improved. Overall the patient is that he has improved since the last visit.  REVIEW OF SYSTEMS: Out of a complete 14 system review of symptoms, the patient complains only of the following symptoms, and all other reviewed systems are negative.  ALLERGIES: No Known Allergies  HOME MEDICATIONS: Outpatient Prescriptions Prior to Visit  Medication Sig Dispense Refill  . Multiple Vitamins-Minerals (MULTIVITAMIN WITH MINERALS) tablet Take 1 tablet by mouth daily.    Marland Kitchen b complex vitamins tablet Take 1 tablet by mouth daily.     No facility-administered medications prior to visit.    PAST MEDICAL HISTORY: Past Medical History  Diagnosis Date  . Bell's palsy 11/17/2013    right    PAST SURGICAL HISTORY: Past Surgical History  Procedure Laterality Date  . None      FAMILY HISTORY: Family History  Problem Relation Age of Onset  . Diabetes Maternal Grandmother   . Kidney disease Maternal Grandfather   . Diabetes Paternal Grandmother     SOCIAL HISTORY: Social History   Social History  . Marital Status: Married    Spouse Name: N/A  . Number of Children: 0  . 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 Smoking status: Never Smoker   . Smokeless tobacco: Never Used  . Alcohol Use: No  . Drug Use: No  . Sexual Activity: Not on file   Other Topics Concern  . Not on file   Social History Narrative   Patient is married, no children.   Patient is right handed.   Patient has college education.   Patient drinks 2 sodas a week.      PHYSICAL EXAM  Filed Vitals:   04/20/15 1102  BP: 106/67  Pulse: 72  Height: 6' (1.829 m)  Weight: 186 lb (84.369 kg)   Body mass index is 25.22 kg/(m^2).  Generalized: Well developed, in no acute distress    Neurological examination  Mentation: Alert oriented to time, place, history taking. Follows all commands speech and language fluent Cranial nerve II-XII: Pupils were equal round reactive to light. Extraocular movements were full, visual field were full on confrontational test. Facial sensation and strength were normal. Face asymmetrical on the right. Left eyebrow raises slightly higher than the right eyebrow. Cheek puff weakness on the right. With his face in a resting position the right upper lip does not touch the bottom lip. Uvula tongue midline. Head turning and shoulder shrug  were normal and symmetric. Motor: The motor testing reveals 5 over 5 strength of all 4 extremities. Good symmetric motor tone is noted throughout.  Sensory: Sensory testing is intact to soft touch on all 4 extremities. No evidence of extinction is noted.  Coordination: Cerebellar testing reveals good finger-nose-finger and heel-to-shin bilaterally.  Gait and station: Gait is normal.  Reflexes: Deep tendon reflexes are symmetric and normal bilaterally.   DIAGNOSTIC DATA (LABS, IMAGING, TESTING) - I reviewed patient records, labs, notes, testing and imaging myself where available.   ASSESSMENT AND PLAN 34 y.o. year old male  has a past medical history of Bell's palsy (11/17/2013). here with:  1. Bell's palsy  The patient's residual symptoms is very mild. However the patient is inquiring about Botox treatment. I explained that at this point Botox would be more for cosmetic reasons and therefore our office typically does not do injections for Bell's palsy. I will however discuss this with one of our neurologist and get back with the patient. Patient is amenable to this. He will follow-up on an as-needed basis.   Butch PennyMegan Verbena Boeding, MSN, NP-C 04/20/2015, 11:35 AM [facility] [address], [address] [place], [place] [address] [phone]

## 2015-04-20 NOTE — Progress Notes (Signed)
I have read the note, and I agree with the clinical assessment and plan.  WILLIS,CHARLES KEITH   

## 2015-04-20 NOTE — Patient Instructions (Signed)
I will consult with Dr. Lucia GaskinsAhern regarding Botox and call you.

## 2015-04-26 ENCOUNTER — Telehealth: Payer: Self-pay | Admitting: Adult Health

## 2015-04-26 NOTE — Telephone Encounter (Signed)
I called the patient. I explained that I am talking to the Botox representative in regards to treatment with Botox therapy for Bell's palsy. When she calls me back I will relay this information to the patient. He verbalized understanding.

## 2015-08-31 DIAGNOSIS — J329 Chronic sinusitis, unspecified: Secondary | ICD-10-CM | POA: Diagnosis not present

## 2015-09-06 ENCOUNTER — Observation Stay (HOSPITAL_BASED_OUTPATIENT_CLINIC_OR_DEPARTMENT_OTHER)
Admission: EM | Admit: 2015-09-06 | Discharge: 2015-09-08 | Disposition: A | Discharge status: Home / Self Care | Payer: BLUE CROSS/BLUE SHIELD | Attending: General Surgery | Admitting: General Surgery

## 2015-09-06 ENCOUNTER — Emergency Department (HOSPITAL_BASED_OUTPATIENT_CLINIC_OR_DEPARTMENT_OTHER): Payer: BLUE CROSS/BLUE SHIELD

## 2015-09-06 ENCOUNTER — Encounter (HOSPITAL_BASED_OUTPATIENT_CLINIC_OR_DEPARTMENT_OTHER): Payer: Self-pay | Admitting: *Deleted

## 2015-09-06 DIAGNOSIS — K388 Other specified diseases of appendix: Secondary | ICD-10-CM | POA: Diagnosis not present

## 2015-09-06 DIAGNOSIS — K353 Acute appendicitis with localized peritonitis, without perforation or gangrene: Secondary | ICD-10-CM

## 2015-09-06 DIAGNOSIS — K3580 Unspecified acute appendicitis: Secondary | ICD-10-CM | POA: Diagnosis present

## 2015-09-06 DIAGNOSIS — R1031 Right lower quadrant pain: Secondary | ICD-10-CM | POA: Diagnosis present

## 2015-09-06 LAB — CBC
HEMATOCRIT: 40.6 % (ref 39.0–52.0)
Hemoglobin: 14.1 g/dL (ref 13.0–17.0)
MCH: 29.9 pg (ref 26.0–34.0)
MCHC: 34.7 g/dL (ref 30.0–36.0)
MCV: 86.2 fL (ref 78.0–100.0)
Platelets: 187 10*3/uL (ref 150–400)
RBC: 4.71 MIL/uL (ref 4.22–5.81)
RDW: 12.1 % (ref 11.5–15.5)
WBC: 13 10*3/uL — ABNORMAL HIGH (ref 4.0–10.5)

## 2015-09-06 LAB — COMPREHENSIVE METABOLIC PANEL
ALBUMIN: 3.9 g/dL (ref 3.5–5.0)
ALT: 13 U/L — ABNORMAL LOW (ref 17–63)
AST: 17 U/L (ref 15–41)
Alkaline Phosphatase: 46 U/L (ref 38–126)
Anion gap: 8 (ref 5–15)
BUN: 13 mg/dL (ref 6–20)
CHLORIDE: 103 mmol/L (ref 101–111)
CO2: 30 mmol/L (ref 22–32)
Calcium: 8.9 mg/dL (ref 8.9–10.3)
Creatinine, Ser: 1.01 mg/dL (ref 0.61–1.24)
GFR calc Af Amer: >60 mL/min (ref >60)
GFR calc non Af Amer: >60 mL/min (ref >60)
GLUCOSE: 98 mg/dL (ref 65–99)
POTASSIUM: 3.8 mmol/L (ref 3.5–5.1)
SODIUM: 141 mmol/L (ref 135–145)
Total Bilirubin: 0.6 mg/dL (ref 0.3–1.2)
Total Protein: 7.3 g/dL (ref 6.5–8.1)

## 2015-09-06 LAB — URINALYSIS, ROUTINE W REFLEX MICROSCOPIC
Bilirubin Urine: NEGATIVE
Glucose, UA: NEGATIVE mg/dL
Hgb urine dipstick: NEGATIVE
Ketones, ur: NEGATIVE mg/dL
LEUKOCYTES UA: NEGATIVE
Nitrite: NEGATIVE
PROTEIN: NEGATIVE mg/dL
Specific Gravity, Urine: 1.024 (ref 1.005–1.030)
pH: 7 (ref 5.0–8.0)

## 2015-09-06 LAB — LIPASE, BLOOD: LIPASE: 23 U/L (ref 11–51)

## 2015-09-06 MED ORDER — IOPAMIDOL (ISOVUE-300) INJECTION 61%
100.0000 mL | Freq: Once | INTRAVENOUS | Status: AC | PRN
  Administered 2015-09-06: 100 mL via INTRAVENOUS

## 2015-09-06 MED ORDER — SODIUM CHLORIDE 0.9 % IV BOLUS (SEPSIS)
1000.0000 mL | Freq: Once | INTRAVENOUS | Status: AC
  Administered 2015-09-06: 1000 mL via INTRAVENOUS

## 2015-09-06 MED ORDER — HYDROMORPHONE HCL 1 MG/ML IJ SOLN
1.0000 mg | Freq: Once | INTRAMUSCULAR | Status: AC
Start: 2015-09-06 — End: 2015-09-07
  Administered 2015-09-07: 1 mg via INTRAVENOUS
  Filled 2015-09-06 (×2): qty 1

## 2015-09-06 MED ORDER — ONDANSETRON HCL 4 MG/2ML IJ SOLN
4.0000 mg | Freq: Once | INTRAMUSCULAR | Status: AC
  Administered 2015-09-06: 4 mg via INTRAVENOUS
  Filled 2015-09-06: qty 2

## 2015-09-06 MED ORDER — MORPHINE SULFATE (PF) 4 MG/ML IV SOLN
4.0000 mg | Freq: Once | INTRAVENOUS | Status: AC
  Administered 2015-09-06: 4 mg via INTRAVENOUS
  Filled 2015-09-06: qty 1

## 2015-09-06 NOTE — ED Notes (Signed)
Pt c/o right lower abd pain x 2 days 

## 2015-09-06 NOTE — ED Provider Notes (Signed)
CSN: [id number]     Arrival date & time 09/06/15  1926 History   First MD Initiated Contact with Patient 09/06/15 2236     Chief Complaint  Patient presents with  . Abdominal Pain     (Consider location/radiation/quality/duration/timing/severity/associated sxs/prior Treatment) HPI   Joshua SpiresScott Underwood is a 35 y.o. male, Patient with no pertinent past medical history, presenting to the ED with Right lower quadrant abdominal pain that began around 1 PM today. Patient also complains of nausea and 2 episodes of loose stool. Patient rates his pain at 7 out of 10, stabbing, nonradiating. Last oral intake around 6 PM this evening. Patient denies previous surgeries. Further denies fevers/chills, vomiting, urinary complaints, penile discharge, testicular pain, or any other complaints.  Past Medical History  Diagnosis Date  . Bell's palsy 11/17/2013    right   Past Surgical History  Procedure Laterality Date  . None     Family History  Problem Relation Age of Onset  . Diabetes Maternal Grandmother   . Kidney disease Maternal Grandfather   . Diabetes Paternal Grandmother    Social History  Substance Use Topics  . Smoking status: Never Smoker   . Smokeless tobacco: Never Used  . Alcohol Use: No    Review of Systems  Constitutional: Negative for fever, chills and diaphoresis.  Gastrointestinal: Positive for nausea, abdominal pain and diarrhea. Negative for vomiting and blood in stool.  Genitourinary: Negative for dysuria, discharge, scrotal swelling and testicular pain.  Skin: Negative for color change and pallor.  All other systems reviewed and are negative.     Allergies  Review of patient's allergies indicates no known allergies.  Home Medications   Prior to Admission medications   Medication Sig Start Date End Date Taking? Authorizing Provider  amoxicillin-clavulanate (AUGMENTIN) 875-125 MG tablet Take 1 tablet by mouth 2 (two) times daily.   Yes Historical Provider, MD  Multiple  Vitamins-Minerals (MULTIVITAMIN WITH MINERALS) tablet Take 1 tablet by mouth daily.    Historical Provider, MD   BP 117/63 mmHg  Pulse 68  Temp(Src) 98.5 F (36.9 C) (Oral)  Resp 18  Ht 6' (1.829 m)  Wt 86.183 kg  BMI 25.76 kg/m2  SpO2 100% Physical Exam  Constitutional: He is oriented to person, place, and time. He appears well-developed and well-nourished. No distress.  HENT:  Head: Normocephalic and atraumatic.  Eyes: Conjunctivae are normal. Pupils are equal, round, and reactive to light.  Neck: Neck supple.  Cardiovascular: Normal rate, regular rhythm, normal heart sounds and intact distal pulses.   Pulmonary/Chest: Effort normal and breath sounds normal. No respiratory distress.  Abdominal: Soft. Normal appearance and bowel sounds are normal. There is tenderness in the right lower quadrant. There is tenderness at McBurney's point. There is no guarding.  Genitourinary:  No scrotal tenderness or swelling. No sign of torsion. No penile discharge. Overall normal male genitalia.  Musculoskeletal: He exhibits no edema or tenderness.  Lymphadenopathy:    He has no cervical adenopathy.  Neurological: He is alert and oriented to person, place, and time.  Skin: Skin is warm and dry. He is not diaphoretic.  Psychiatric: He has a normal mood and affect. His behavior is normal.  Nursing note and vitals reviewed.   ED Course  Procedures (including critical care time) Labs Review Labs Reviewed  URINALYSIS, ROUTINE W REFLEX MICROSCOPIC (NOT AT [facility]) - Abnormal; Notable for the following:    APPearance CLOUDY (*)    All other components within normal limits  COMPREHENSIVE METABOLIC  PANEL - Abnormal; Notable for the following:    ALT 13 (*)    All other components within normal limits  CBC - Abnormal; Notable for the following:    WBC 13.0 (*)    All other components within normal limits  LIPASE, BLOOD    Imaging Review Ct Abdomen Pelvis W Contrast  09/07/2015  CLINICAL DATA:   Right lower quadrant pain for 2 days. Diarrhea. Patient is using Augmentin for sinusitis. EXAM: CT ABDOMEN AND PELVIS WITH CONTRAST TECHNIQUE: Multidetector CT imaging of the abdomen and pelvis was performed using the standard protocol following bolus administration of intravenous contrast. CONTRAST:  ISOVUE-300 IOPAMIDOL (ISOVUE-300) INJECTION 61% COMPARISON:  None. FINDINGS: The lung bases are clear. Spleen is mildly enlarged. No focal lesions. The liver, gallbladder, pancreas, adrenal glands, kidneys, abdominal aorta, and retroperitoneal lymph nodes are unremarkable. Stomach, small bowel, and colon are not abnormally distended. No free air or free fluid in the abdomen. Pelvis: There is a large at appendicolith in the appendix with diffuse dilatation of the appendix and mild periappendiceal infiltration. Appendiceal diameter measures up to about 2.3 cm. Changes are consistent with acute appendicitis. No abscess. Small amount of free fluid in the pelvis is likely reactive. Prostate gland is not enlarged. Bladder wall is not thickened. No pelvic mass or lymphadenopathy. No destructive bone lesions. IMPRESSION: Large epiglottic a left with appendiceal distention and periappendiceal stranding consistent with acute appendicitis. No abscess. Electronically Signed   By: Burman Nieves M.D.   On: 09/07/2015 00:19   I have personally reviewed and evaluated these images and lab results as part of my medical decision-making.   EKG Interpretation None       Medications  HYDROmorphone (DILAUDID) injection 1 mg (0 mg Intravenous Hold 09/06/15 2329)  cefTRIAXone (ROCEPHIN) 2 g in dextrose 5 % 50 mL IVPB (2 g Intravenous New Bag/Given 09/07/15 0043)    And  metroNIDAZOLE (FLAGYL) IVPB 500 mg (not administered)  sodium chloride 0.9 % bolus 1,000 mL (0 mLs Intravenous Stopped 09/07/15 0008)  morphine 4 MG/ML injection 4 mg (4 mg Intravenous Given 09/06/15 2257)  ondansetron (ZOFRAN) injection 4 mg (4 mg Intravenous  Given 09/06/15 2257)  iopamidol (ISOVUE-300) 61 % injection 100 mL (100 mLs Intravenous Contrast Given 09/06/15 2357)  sodium chloride 0.9 % bolus 1,000 mL (1,000 mLs Intravenous New Bag/Given 09/07/15 0042)   Orders Placed This Encounter  Procedures  . CT Abdomen Pelvis W Contrast  . Urinalysis, Routine w reflex microscopic (not at [facility])  . Lipase, blood  . Comprehensive metabolic panel  . CBC  . Diet NPO time specified  . Saline Lock IV, Maintain IV access  . Consult to general surgery    MDM   Final diagnoses:  Acute appendicitis with localized peritonitis    Joshua Underwood presents with right lower quadrant abdominal pain that began suddenly this afternoon.  Findings and plan of care discussed with Doug Sou, MD and then with Dr. Nicanor Alcon upon EDP shift change.  Suspect appendicitis. Abdominal CT confirms this suspicion. General surgery consultation. Appropriate antibiotics started. Patient notified of this information. Advised that he may have to have surgery tonight and will be transferred to another hospital. Patient is comfortable with this. 12:49 AM Spoke with Dr. Daphine Deutscher, general surgeon, who Requested that the patient be transferred to [facility] ED. Dr. Daphine Deutscher will evaluate him for surgery at that time. Dr. Judd Lien, EDP attending at [facility], was notified about this patient.    Filed Vitals:  09/06/15 1932 09/06/15 2149 09/07/15 0021  BP: 120/72 119/62 117/63  Pulse: 64 65 68  Temp: 98.1 F (36.7 C)  98.5 F (36.9 C)  TempSrc: Oral  Oral  Resp: Height: 6' (1.829 m)    Weight: 86.183 kg    SpO2: 100% 100% 100%     Anselm Pancoast, PA-C 09/07/15 0054  Doug Sou, MD 09/07/15 1547

## 2015-09-06 NOTE — ED Notes (Addendum)
Pt c/o "indigestion" type pain for the last two days that has localized to RLQ today.  He is tender in RLQ.  He denies n/v, has had two episodes of diarrhea today.  Pt is on abx, day 5 of augmentin for a sinus infection.

## 2015-09-06 NOTE — ED Notes (Signed)
Provider at bedside

## 2015-09-07 ENCOUNTER — Inpatient Hospital Stay (HOSPITAL_COMMUNITY): Payer: BLUE CROSS/BLUE SHIELD | Admitting: Certified Registered Nurse Anesthetist

## 2015-09-07 ENCOUNTER — Encounter (HOSPITAL_COMMUNITY): Admission: EM | Disposition: A | Discharge status: Home / Self Care | Payer: Self-pay | Source: Home / Self Care

## 2015-09-07 ENCOUNTER — Encounter (HOSPITAL_COMMUNITY): Payer: Self-pay | Admitting: General Surgery

## 2015-09-07 DIAGNOSIS — K3580 Unspecified acute appendicitis: Secondary | ICD-10-CM | POA: Diagnosis present

## 2015-09-07 DIAGNOSIS — D72829 Elevated white blood cell count, unspecified: Secondary | ICD-10-CM | POA: Diagnosis not present

## 2015-09-07 DIAGNOSIS — K388 Other specified diseases of appendix: Secondary | ICD-10-CM | POA: Diagnosis not present

## 2015-09-07 DIAGNOSIS — R1031 Right lower quadrant pain: Secondary | ICD-10-CM | POA: Diagnosis not present

## 2015-09-07 HISTORY — PX: LAPAROSCOPIC APPENDECTOMY: SHX408

## 2015-09-07 LAB — CREATININE, SERUM
CREATININE: 1.03 mg/dL (ref 0.61–1.24)
GFR calc non Af Amer: >60 mL/min (ref >60)

## 2015-09-07 LAB — CBC
HCT: 35.8 % — ABNORMAL LOW (ref 39.0–52.0)
Hemoglobin: 12.4 g/dL — ABNORMAL LOW (ref 13.0–17.0)
MCH: 29.5 pg (ref 26.0–34.0)
MCHC: 34.6 g/dL (ref 30.0–36.0)
MCV: 85.2 fL (ref 78.0–100.0)
PLATELETS: 149 10*3/uL — AB (ref 150–400)
RBC: 4.2 MIL/uL — AB (ref 4.22–5.81)
RDW: 12.1 % (ref 11.5–15.5)
WBC: 11.7 10*3/uL — ABNORMAL HIGH (ref 4.0–10.5)

## 2015-09-07 LAB — SURGICAL PCR SCREEN
MRSA, PCR: NEGATIVE
STAPHYLOCOCCUS AUREUS: NEGATIVE

## 2015-09-07 SURGERY — APPENDECTOMY, LAPAROSCOPIC
Anesthesia: General | Site: Abdomen

## 2015-09-07 MED ORDER — DEXAMETHASONE SODIUM PHOSPHATE 10 MG/ML IJ SOLN
INTRAMUSCULAR | Status: AC
  Filled 2015-09-07: qty 1

## 2015-09-07 MED ORDER — METRONIDAZOLE IN NACL 5-0.79 MG/ML-% IV SOLN
500.0000 mg | Freq: Once | INTRAVENOUS | Status: AC
  Administered 2015-09-07: 500 mg via INTRAVENOUS
  Filled 2015-09-07: qty 100

## 2015-09-07 MED ORDER — HYDROMORPHONE HCL 1 MG/ML IJ SOLN
1.0000 mg | INTRAMUSCULAR | Status: DC | PRN
  Administered 2015-09-07: 1 mg via INTRAVENOUS

## 2015-09-07 MED ORDER — ONDANSETRON 4 MG PO TBDP: 4.0000 mg | ORAL_TABLET | Freq: Four times a day (QID) | ORAL | Status: DC | PRN

## 2015-09-07 MED ORDER — DEXTROSE 5 % IV SOLN: 1.0000 g | Freq: Once | INTRAVENOUS | Status: DC

## 2015-09-07 MED ORDER — ENOXAPARIN SODIUM 40 MG/0.4ML ~~LOC~~ SOLN
40.0000 mg | SUBCUTANEOUS | Status: DC
  Filled 2015-09-07 (×2): qty 0.4

## 2015-09-07 MED ORDER — METOCLOPRAMIDE HCL 5 MG/ML IJ SOLN
INTRAMUSCULAR | Status: AC
  Filled 2015-09-07: qty 2

## 2015-09-07 MED ORDER — MIDAZOLAM HCL 5 MG/5ML IJ SOLN
INTRAMUSCULAR | Status: DC | PRN
Start: 2015-09-07 — End: 2015-09-07
  Administered 2015-09-07: 2 mg via INTRAVENOUS

## 2015-09-07 MED ORDER — FENTANYL CITRATE (PF) 100 MCG/2ML IJ SOLN
INTRAMUSCULAR | Status: DC | PRN
  Administered 2015-09-07 (×3): 50 ug via INTRAVENOUS

## 2015-09-07 MED ORDER — ROCURONIUM BROMIDE 50 MG/5ML IV SOLN
INTRAVENOUS | Status: AC
  Filled 2015-09-07: qty 1

## 2015-09-07 MED ORDER — ONDANSETRON HCL 4 MG/2ML IJ SOLN
INTRAMUSCULAR | Status: AC
  Filled 2015-09-07: qty 2

## 2015-09-07 MED ORDER — FENTANYL CITRATE (PF) 100 MCG/2ML IJ SOLN
INTRAMUSCULAR | Status: AC
  Filled 2015-09-07: qty 2

## 2015-09-07 MED ORDER — HYDROCODONE-ACETAMINOPHEN 5-325 MG PO TABS
1.0000 | ORAL_TABLET | ORAL | Status: DC | PRN
  Administered 2015-09-07 – 2015-09-08 (×4): 2 via ORAL
  Filled 2015-09-07 (×4): qty 2

## 2015-09-07 MED ORDER — DEXTROSE 5 % IV SOLN
2.0000 g | Freq: Once | INTRAVENOUS | Status: AC
  Administered 2015-09-07: 2 g via INTRAVENOUS
  Filled 2015-09-07: qty 2

## 2015-09-07 MED ORDER — HYDROMORPHONE HCL 1 MG/ML IJ SOLN
1.0000 mg | INTRAMUSCULAR | Status: DC | PRN
  Administered 2015-09-07: 1 mg via INTRAVENOUS
  Filled 2015-09-07: qty 1

## 2015-09-07 MED ORDER — METRONIDAZOLE IN NACL 5-0.79 MG/ML-% IV SOLN
500.0000 mg | Freq: Three times a day (TID) | INTRAVENOUS | Status: DC
  Administered 2015-09-07 – 2015-09-08 (×3): 500 mg via INTRAVENOUS
  Filled 2015-09-07 (×3): qty 100

## 2015-09-07 MED ORDER — FENTANYL CITRATE (PF) 100 MCG/2ML IJ SOLN
25.0000 ug | INTRAMUSCULAR | Status: DC | PRN
  Administered 2015-09-07 (×2): 50 ug via INTRAVENOUS

## 2015-09-07 MED ORDER — MORPHINE SULFATE (PF) 2 MG/ML IV SOLN
1.0000 mg | INTRAVENOUS | Status: DC | PRN
  Administered 2015-09-07: 2 mg via INTRAVENOUS
  Filled 2015-09-07: qty 1

## 2015-09-07 MED ORDER — BUPIVACAINE-EPINEPHRINE 0.5% -1:200000 IJ SOLN
INTRAMUSCULAR | Status: DC | PRN
Start: 2015-09-07 — End: 2015-09-07
  Administered 2015-09-07: 10 mL

## 2015-09-07 MED ORDER — ONDANSETRON HCL 4 MG/2ML IJ SOLN: 4.0000 mg | Freq: Four times a day (QID) | INTRAMUSCULAR | Status: DC | PRN

## 2015-09-07 MED ORDER — BUPIVACAINE-EPINEPHRINE 0.5% -1:200000 IJ SOLN
INTRAMUSCULAR | Status: AC
  Filled 2015-09-07: qty 1

## 2015-09-07 MED ORDER — SUCCINYLCHOLINE CHLORIDE 20 MG/ML IJ SOLN
INTRAMUSCULAR | Status: DC | PRN
  Administered 2015-09-07: 100 mg via INTRAVENOUS

## 2015-09-07 MED ORDER — MIDAZOLAM HCL 2 MG/2ML IJ SOLN
INTRAMUSCULAR | Status: AC
  Filled 2015-09-07: qty 2

## 2015-09-07 MED ORDER — METOCLOPRAMIDE HCL 5 MG/ML IJ SOLN: 10.0000 mg | Freq: Once | INTRAMUSCULAR | Status: DC | PRN

## 2015-09-07 MED ORDER — LACTATED RINGERS IV SOLN: INTRAVENOUS | Status: DC

## 2015-09-07 MED ORDER — ROCURONIUM BROMIDE 100 MG/10ML IV SOLN
INTRAVENOUS | Status: DC | PRN
  Administered 2015-09-07: 30 mg via INTRAVENOUS
  Administered 2015-09-07: 10 mg via INTRAVENOUS

## 2015-09-07 MED ORDER — LIDOCAINE HCL (CARDIAC) 20 MG/ML IV SOLN
INTRAVENOUS | Status: AC
Start: 2015-09-07 — End: 2015-09-07
  Filled 2015-09-07: qty 5

## 2015-09-07 MED ORDER — DEXAMETHASONE SODIUM PHOSPHATE 4 MG/ML IJ SOLN
INTRAMUSCULAR | Status: DC | PRN
  Administered 2015-09-07: 10 mg via INTRAVENOUS

## 2015-09-07 MED ORDER — MEPERIDINE HCL 50 MG/ML IJ SOLN: 6.2500 mg | INTRAMUSCULAR | Status: DC | PRN

## 2015-09-07 MED ORDER — ONDANSETRON HCL 4 MG/2ML IJ SOLN
INTRAMUSCULAR | Status: DC | PRN
  Administered 2015-09-07: 4 mg via INTRAVENOUS

## 2015-09-07 MED ORDER — SODIUM CHLORIDE 0.9 % IV BOLUS (SEPSIS)
1000.0000 mL | Freq: Once | INTRAVENOUS | Status: AC
Start: 2015-09-07 — End: 2015-09-07
  Administered 2015-09-07: 1000 mL via INTRAVENOUS

## 2015-09-07 MED ORDER — SUGAMMADEX SODIUM 200 MG/2ML IV SOLN
INTRAVENOUS | Status: DC | PRN
  Administered 2015-09-07: 200 mg via INTRAVENOUS

## 2015-09-07 MED ORDER — DEXTROSE 5 % IV SOLN
2.0000 g | INTRAVENOUS | Status: DC
  Filled 2015-09-07: qty 2

## 2015-09-07 MED ORDER — LACTATED RINGERS IV SOLN
INTRAVENOUS | Status: DC
  Administered 2015-09-07 (×2): via INTRAVENOUS

## 2015-09-07 MED ORDER — SODIUM CHLORIDE 0.9 % IV SOLN
INTRAVENOUS | Status: DC
  Administered 2015-09-07: 06:00:00 via INTRAVENOUS

## 2015-09-07 MED ORDER — METOCLOPRAMIDE HCL 5 MG/ML IJ SOLN
INTRAMUSCULAR | Status: DC | PRN
  Administered 2015-09-07: 10 mg via INTRAVENOUS

## 2015-09-07 MED ORDER — DEXTROSE 5 % IV SOLN
2.0000 g | INTRAVENOUS | Status: DC
  Administered 2015-09-07: 12:00:00 via INTRAVENOUS
  Administered 2015-09-07: 2 g via INTRAVENOUS
  Filled 2015-09-07: qty 2

## 2015-09-07 MED ORDER — HYDROMORPHONE HCL 1 MG/ML IJ SOLN
INTRAMUSCULAR | Status: AC
  Filled 2015-09-07: qty 1

## 2015-09-07 MED ORDER — PROPOFOL 10 MG/ML IV BOLUS
INTRAVENOUS | Status: DC | PRN
  Administered 2015-09-07: 200 mg via INTRAVENOUS

## 2015-09-07 MED ORDER — METRONIDAZOLE IN NACL 5-0.79 MG/ML-% IV SOLN
500.0000 mg | Freq: Three times a day (TID) | INTRAVENOUS | Status: DC
  Administered 2015-09-07: 500 mg via INTRAVENOUS
  Filled 2015-09-07 (×2): qty 100

## 2015-09-07 MED ORDER — DEXTROSE 5 % IV SOLN: 2.0000 g | INTRAVENOUS | Status: DC

## 2015-09-07 MED ORDER — METRONIDAZOLE IN NACL 5-0.79 MG/ML-% IV SOLN: 500.0000 mg | Freq: Once | INTRAVENOUS | Status: DC

## 2015-09-07 MED ORDER — LACTATED RINGERS IV SOLN
INTRAVENOUS | Status: DC
  Administered 2015-09-07 – 2015-09-08 (×2): via INTRAVENOUS
  Filled 2015-09-07 (×5): qty 1000

## 2015-09-07 MED ORDER — PROPOFOL 10 MG/ML IV BOLUS
INTRAVENOUS | Status: AC
  Filled 2015-09-07: qty 20

## 2015-09-07 MED ORDER — LIDOCAINE HCL (CARDIAC) 20 MG/ML IV SOLN
INTRAVENOUS | Status: DC | PRN
  Administered 2015-09-07: 50 mg via INTRAVENOUS

## 2015-09-07 MED ORDER — METHOCARBAMOL 500 MG PO TABS
500.0000 mg | ORAL_TABLET | Freq: Four times a day (QID) | ORAL | Status: DC | PRN
  Administered 2015-09-08: 500 mg via ORAL
  Filled 2015-09-07: qty 1

## 2015-09-07 MED ORDER — DEXTROSE 5 % IV SOLN
INTRAVENOUS | Status: AC
  Filled 2015-09-07: qty 2

## 2015-09-07 MED ORDER — SUGAMMADEX SODIUM 200 MG/2ML IV SOLN
INTRAVENOUS | Status: AC
  Filled 2015-09-07: qty 2

## 2015-09-07 MED ORDER — METRONIDAZOLE IN NACL 5-0.79 MG/ML-% IV SOLN: 500.0000 mg | Freq: Three times a day (TID) | INTRAVENOUS | Status: DC

## 2015-09-07 MED ORDER — FENTANYL CITRATE (PF) 250 MCG/5ML IJ SOLN
INTRAMUSCULAR | Status: AC
  Filled 2015-09-07: qty 5

## 2015-09-07 MED ORDER — DEXTROSE 5 % IV SOLN
1000.0000 mg | Freq: Three times a day (TID) | INTRAVENOUS | Status: DC
  Administered 2015-09-07 – 2015-09-08 (×3): 1000 mg via INTRAVENOUS
  Filled 2015-09-07 (×5): qty 10

## 2015-09-07 SURGICAL SUPPLY — 38 items
APPLIER CLIP ROT 10 11.4 M/L (STAPLE)
CABLE HIGH FREQUENCY MONO STRZ (ELECTRODE) IMPLANT
CHLORAPREP W/TINT 26ML (MISCELLANEOUS) ×2 IMPLANT
CLIP APPLIE ROT 10 11.4 M/L (STAPLE) IMPLANT
COVER SURGICAL LIGHT HANDLE (MISCELLANEOUS) ×2 IMPLANT
CUTTER FLEX LINEAR 45M (STAPLE) ×2 IMPLANT
DECANTER SPIKE VIAL GLASS SM (MISCELLANEOUS) ×2 IMPLANT
DEVICE TROCAR PUNCTURE CLOSURE (ENDOMECHANICALS) IMPLANT
DRAPE LAPAROSCOPIC ABDOMINAL (DRAPES) ×2 IMPLANT
ELECT REM PT RETURN 9FT ADLT (ELECTROSURGICAL) ×2
ELECTRODE REM PT RTRN 9FT ADLT (ELECTROSURGICAL) ×1 IMPLANT
ENDOLOOP SUT PDS II  0 18 (SUTURE)
ENDOLOOP SUT PDS II 0 18 (SUTURE) IMPLANT
GAUZE SPONGE 2X2 8PLY STRL LF (GAUZE/BANDAGES/DRESSINGS) IMPLANT
GLOVE BIOGEL PI IND STRL 7.5 (GLOVE) ×2 IMPLANT
GLOVE BIOGEL PI INDICATOR 7.5 (GLOVE) ×2
GLOVE EUDERMIC 7 POWDERFREE (GLOVE) ×2 IMPLANT
GLOVE SURG SIGNA 7.5 PF LTX (GLOVE) ×2 IMPLANT
GOWN STRL REUS W/TWL XL LVL3 (GOWN DISPOSABLE) ×6 IMPLANT
KIT BASIN OR (CUSTOM PROCEDURE TRAY) ×2 IMPLANT
LIQUID BAND (GAUZE/BANDAGES/DRESSINGS) ×2 IMPLANT
POUCH SPECIMEN RETRIEVAL 10MM (ENDOMECHANICALS) ×4 IMPLANT
RELOAD 45 VASCULAR/THIN (ENDOMECHANICALS) IMPLANT
RELOAD STAPLE TA45 3.5 REG BLU (ENDOMECHANICALS) ×2 IMPLANT
SET IRRIG TUBING LAPAROSCOPIC (IRRIGATION / IRRIGATOR) ×2 IMPLANT
SHEARS HARMONIC ACE PLUS 36CM (ENDOMECHANICALS) ×2 IMPLANT
SPONGE GAUZE 2X2 STER 10/PKG (GAUZE/BANDAGES/DRESSINGS)
STRIP CLOSURE SKIN 1/2X4 (GAUZE/BANDAGES/DRESSINGS) IMPLANT
SUT MNCRL AB 4-0 PS2 18 (SUTURE) ×2 IMPLANT
SUT VICRYL 0 UR6 27IN ABS (SUTURE) ×2 IMPLANT
TOWEL OR 17X26 10 PK STRL BLUE (TOWEL DISPOSABLE) ×2 IMPLANT
TOWEL OR NON WOVEN STRL DISP B (DISPOSABLE) ×2 IMPLANT
TRAY FOLEY W/METER SILVER 14FR (SET/KITS/TRAYS/PACK) ×2 IMPLANT
TRAY FOLEY W/METER SILVER 16FR (SET/KITS/TRAYS/PACK) ×2 IMPLANT
TRAY LAPAROSCOPIC (CUSTOM PROCEDURE TRAY) ×2 IMPLANT
TROCAR BLADELESS OPT 5 100 (ENDOMECHANICALS) ×2 IMPLANT
TROCAR XCEL 12X100 BLDLESS (ENDOMECHANICALS) ×2 IMPLANT
TROCAR XCEL BLUNT TIP 100MML (ENDOMECHANICALS) ×2 IMPLANT

## 2015-09-07 NOTE — Transfer of Care (Signed)
Immediate Anesthesia Transfer of Care Note  Patient: Herbert SpiresScott Engelken  Procedure(s) Performed: Procedure(s): APPENDECTOMY LAPAROSCOPIC POSSIBLE OPEN (N/A)  Patient Location: PACU  Anesthesia Type:General  Level of Consciousness: Patient easily awoken, sedated, comfortable, cooperative, following commands, responds to stimulation.   Airway & Oxygen Therapy: Patient spontaneously breathing, ventilating well, oxygen via simple oxygen mask.  Post-op Assessment: Report given to PACU RN, vital signs reviewed and stable, moving all extremities.   Post vital signs: Reviewed and stable.  Complications: No apparent anesthesia complications

## 2015-09-07 NOTE — Anesthesia Postprocedure Evaluation (Signed)
Anesthesia Post Note  Patient: Joshua Underwood  Procedure(s) Performed: Procedure(s) (LRB): APPENDECTOMY LAPAROSCOPIC (N/A)  Patient location during evaluation: PACU Anesthesia Type: General Level of consciousness: awake and alert Pain management: pain level controlled Vital Signs Assessment: post-procedure vital signs reviewed and stable Respiratory status: spontaneous breathing, nonlabored ventilation, respiratory function stable and patient connected to nasal cannula oxygen Cardiovascular status: blood pressure returned to baseline and stable Postop Assessment: no signs of nausea or vomiting Anesthetic complications: no    Last Vitals:  Filed Vitals:   09/07/15 1444 09/07/15 1600  BP: 114/57 110/62  Pulse: 72 70  Temp: 36.8 C 36.7 C  Resp: 12 16    Last Pain:  Filed Vitals:   09/07/15 1619  PainSc: 4                  Joshua Underwood, Joshua Underwood

## 2015-09-07 NOTE — Anesthesia Procedure Notes (Signed)
Procedure Name: Intubation Date/Time: 09/07/2015 11:56 AM Performed by: Ludwig LeanJONES, Tonee Silverstein C Pre-anesthesia Checklist: Patient identified, Emergency Drugs available, Suction available and Patient being monitored Patient Re-evaluated:Patient Re-evaluated prior to inductionOxygen Delivery Method: Circle System Utilized Preoxygenation: Pre-oxygenation with 100% oxygen Intubation Type: IV induction, Cricoid Pressure applied and Rapid sequence Ventilation: Mask ventilation without difficulty Laryngoscope Size: Mac and 4 Grade View: Grade I Tube type: Oral Tube size: 8.0 mm Number of attempts: 1 Airway Equipment and Method: Oral airway Placement Confirmation: ETT inserted through vocal cords under direct vision,  positive ETCO2 and breath sounds checked- equal and bilateral Secured at: 24 cm Tube secured with: Tape Dental Injury: Teeth and Oropharynx as per pre-operative assessment

## 2015-09-07 NOTE — Progress Notes (Signed)
Gen. Surgery:  Patient apparently transferred from [facility] last night. Apparently care plan discussed with Dr. Daphine DeutscherMartin.  36 hour history of right lower quadrant pain and anorexia.  Had been on Augmentin for 1 week because of sinusitis. Abdominal exam reveals healthy man in mild-to-moderate distress. Abdomen tender right lower quadrant with guarding.  Some referred pain but noted evidence of diffuse peritonitis CT scan shows large appendicolith and diffuse dilatation of the appendix.  There is no abscess or evidence of rupture.  It looks partially retrocecal W BC 13,000.  Other labs normal.  Assessment/plan: Acute appendicitis.  No obvious evidence of rupture, but may have been smoldering for a few days and suppressed by the Augmentin Continue Rocephin and Flagyl To OR today for laparoscopic appendectomy I discussed the indications, details, techniques, and numerous risk of the surgery with the patient and his wife.  He is aware the risk of bleeding, infection, wound hernia, conversion to open laparotomy, injury to adjacent organs with major reconstructive surgery, and other unforeseen prompts.  He understands these issues well.  At this time all his questions are answered.  He agrees with this planOR notified.  Full H&P to follow.  Angelia MouldHaywood M. Derrell LollingIngram, M.D., [facility] General and Minimally invasive Surgery Breast and Colorectal Surgery Office:   [phone] Pager:   [phone]

## 2015-09-07 NOTE — Op Note (Signed)
Patient Name:           Herbert SpiresScott Rylander   Date of Surgery:        09/07/2015  Pre op Diagnosis:      Acute appendicitis  Post op Diagnosis:    Acute appendicitis  Procedure:                 Laparoscopic appendectomy  Surgeon:                     Angelia MouldHaywood M. Derrell LollingIngram, M.D., FACS  Assistant:                      OR staff  Operative Indications:   This is a healthy 35 year old gentleman who [facility] last night with right lower quadrant abdominal pain for 36-48 hours.  This was progressive.  He was anorexic.  He had been on Augmentin for 1 week because of sinusitis.  In the emergency department he is found to have localized tenderness and guarding in the right lower quadrant.  WBC 13,000.  CT scan showed a large appendicolith and diffuse dilatation and inflammation of the appendix but no abscess or rupture.  He was started on broad-spectrum antibiotics and is brought to the operating room this morning for appendectomy  Operative Findings:       The appendix was acutely inflamed but had not ruptured and was not gangrenous.  There was a large appendicolith bulging in the proximal appendix 2 cm away from the cecum.  The appendix was partially retrocecal but we were able to mobilize it without too much difficulty.  The terminal ileum and cecum and right colon, liver and gallbladder look normal.  Procedure in Detail:          Following the induction of general endotracheal anesthesia Foley catheter was placed.  The abdomen was prepped and draped in a sterile fashion.  Surgical timeout was performed.  0.5% Marcaine with epinephrine was used as local infiltration anesthetic.  A Hassan cannula was placed in the midline at the upper umbilical rim with an open technique.  Entry was uneventful and without injury.  Pneumoperitoneum was created.  5 mm trocar placed in left lower quadrant and 12 mm trocar placed suprapubic.  Rales mobilize the tip of the appendix lift that up.  I divided  the appendiceal mesentery and several small steps until we had completely skeletonized the appendix   I was able to place an Endo GIA stapler with 3.5 mm staple height across the base of the appendix taking a little bit of the cecum.  This was well away from the fecalith.  Stable coarse closed fired and removed.  The closure looked good.  The appendix was placed in a specimen bag and removed.  There was one tiny bleeding point on the staple line which was touched briefly with the cautery and that took care of this.  The area was irrigated out and everything looked clean.  I repositioned the patient brought the omentum down over the cecal tip.  The pneumoperitoneum was released.  The trochars were removed.  There is no bleeding.  Skin incisions were closed with subcuticular sutures of 4-0 Monocryl and Dermabond.  The patient tolerated procedure well was taken to PACU in stable condition.  EBL 10 mL.  Counts correct.  Complications none.    Angelia MouldHaywood M. Derrell LollingIngram, M.D., FACS General and Minimally Invasive Surgery Breast and Colorectal Surgery  09/07/2015  1:01 PM

## 2015-09-07 NOTE — ED Notes (Signed)
Pt last ate small snack at 1800 and last had PO fluids other than contrast at 1800.  Report given to Barth Kirkseri, Consulting civil [facility]charge [facility].  [facility] to see Dr. Daphine DeutscherMartin from surgery.  Wife is taking pt's belongings with her.

## 2015-09-07 NOTE — ED Notes (Signed)
Bed: WA09 Expected date:  Expected time:  Means of arrival:  Comments[facility] Daphine DeutscherMartin

## 2015-09-07 NOTE — H&P (Signed)
[facility] Admission Note  Joshua Underwood 1981-01-21  [id number].    Requesting MD: Arlean Hopping, PA-C Chief Complaint/Reason for Consult: Acute appendicitis  HPI:  35 y/o healthy white male presented originally to [facility] and was transferred to [facility] hospital c/o right RLQ abdominal pain since Monday and the pain worsened significantly on Tuesday along with anorexia. Pain is stabbing and non-radiating.  Had been on Augmentin for 1 week because of sinusitis, but has missed several doses due to abdominal pain.  No fever/chills, or vomiting.  Does admit to loose stools and nausea.  Finally came to the ED after pain became more severe in the RLQ.  He notes over the last 2-3 hours the pain has significantly worsened since arrival.    CT scan shows large appendicolith and diffuse dilatation of the appendix. There is no abscess or evidence of rupture. It looks partially retrocecal.  WBC 13,000. Other labs normal.  Only previous surgery was WTE.    ROS: All systems reviewed and otherwise negative except for as above  Family History  Problem Relation Age of Onset  . Diabetes Maternal Grandmother   . Kidney disease Maternal Grandfather   . Diabetes Paternal Grandmother     Past Medical History  Diagnosis Date  . Bell's palsy 11/17/2013    right    Past Surgical History  Procedure Laterality Date  . None      Social History:  reports that he has never smoked. He has never used smokeless tobacco. He reports that he does not drink alcohol or use illicit drugs.  Allergies: No Known Allergies  Medications Prior to Admission  Medication Sig Dispense Refill  . amoxicillin-clavulanate (AUGMENTIN) 875-125 MG tablet Take 1 tablet by mouth 2 (two) times daily.      Blood pressure 116/60, pulse 61, temperature 98.3 F (36.8 C), temperature source Oral, resp. rate 16, height 6' (1.829 m), weight 190 lb (86.183 kg), SpO2 93 %. Physical Exam: General: pleasant, WD/WN white male who is  laying in bed in NAD HEENT: head is normocephalic, atraumatic.  Sclera are noninjected.  PERRL.  Ears and nose without any masses or lesions.  Mouth is pink and moist Heart: regular, rate, and rhythm.  No obvious murmurs, gallops, or rubs noted.  Palpable pedal pulses bilaterally Lungs: CTAB, no wheezes, rhonchi, or rales noted.  Respiratory effort nonlabored Abd: soft, mild distension, tender in RLQ, +BS, no masses, hernias, or organomegaly, no surgical scars MS: all 4 extremities are symmetrical with no cyanosis, clubbing, or edema. Skin: warm and dry with no masses, lesions, or rashes Psych: A&Ox3 with an appropriate affect.   Results for orders placed or performed during the hospital encounter of 09/06/15 (from the past 48 hour(s))  Urinalysis, Routine w reflex microscopic (not at [facility])     Status: Abnormal   Collection Time: 09/06/15  7:45 PM  Result Value Ref Range   Color, Urine YELLOW YELLOW   APPearance CLOUDY (A) CLEAR   Specific Gravity, Urine 1.024 1.005 - 1.030   pH 7.0 5.0 - 8.0   Glucose, UA NEGATIVE NEGATIVE mg/dL   Hgb urine dipstick NEGATIVE NEGATIVE   Bilirubin Urine NEGATIVE NEGATIVE   Ketones, ur NEGATIVE NEGATIVE mg/dL   Protein, ur NEGATIVE NEGATIVE mg/dL   Nitrite NEGATIVE NEGATIVE   Leukocytes, UA NEGATIVE NEGATIVE    Comment: MICROSCOPIC NOT DONE ON URINES WITH NEGATIVE PROTEIN, BLOOD, LEUKOCYTES, NITRITE, OR GLUCOSE <1000 mg/dL.  Lipase, blood     Status: None   Collection  Time: 09/06/15  8:44 PM  Result Value Ref Range   Lipase 23 11 - 51 U/L  Comprehensive metabolic panel     Status: Abnormal   Collection Time: 09/06/15  8:44 PM  Result Value Ref Range   Sodium 141 135 - 145 mmol/L   Potassium 3.8 3.5 - 5.1 mmol/L   Chloride 103 101 - 111 mmol/L   CO2 30 22 - 32 mmol/L   Glucose, Bld 98 65 - 99 mg/dL   BUN 13 6 - 20 mg/dL   Creatinine, Ser 1.01 0.61 - 1.24 mg/dL   Calcium 8.9 8.9 - 10.3 mg/dL   Total Protein 7.3 6.5 - 8.1 g/dL   Albumin 3.9 3.5 -  5.0 g/dL   AST 17 15 - 41 U/L   ALT 13 (L) 17 - 63 U/L   Alkaline Phosphatase 46 38 - 126 U/L   Total Bilirubin 0.6 0.3 - 1.2 mg/dL   GFR calc non Af Amer >60 >60 mL/min   GFR calc Af Amer >60 >60 mL/min    Comment: (NOTE) The eGFR has been calculated using the CKD EPI equation. This calculation has not been validated in all clinical situations. eGFR's persistently <60 mL/min signify possible Chronic Kidney Disease.    Anion gap 8 5 - 15  CBC     Status: Abnormal   Collection Time: 09/06/15  8:44 PM  Result Value Ref Range   WBC 13.0 (H) 4.0 - 10.5 K/uL   RBC 4.71 4.22 - 5.81 MIL/uL   Hemoglobin 14.1 13.0 - 17.0 g/dL   HCT 40.6 39.0 - 52.0 %   MCV 86.2 78.0 - 100.0 fL   MCH 29.9 26.0 - 34.0 pg   MCHC 34.7 30.0 - 36.0 g/dL   RDW 12.1 11.5 - 15.5 %   Platelets 187 150 - 400 K/uL   Ct Abdomen Pelvis W Contrast  09/07/2015  CLINICAL DATA:  Right lower quadrant pain for 2 days. Diarrhea. Patient is using Augmentin for sinusitis. EXAM: CT ABDOMEN AND PELVIS WITH CONTRAST TECHNIQUE: Multidetector CT imaging of the abdomen and pelvis was performed using the standard protocol following bolus administration of intravenous contrast. CONTRAST:  171m ISOVUE-300 IOPAMIDOL (ISOVUE-300) INJECTION 61% COMPARISON:  None. FINDINGS: The lung bases are clear. Spleen is mildly enlarged. No focal lesions. The liver, gallbladder, pancreas, adrenal glands, kidneys, abdominal aorta, and retroperitoneal lymph nodes are unremarkable. Stomach, small bowel, and colon are not abnormally distended. No free air or free fluid in the abdomen. Pelvis: There is a large at appendicolith in the appendix with diffuse dilatation of the appendix and mild periappendiceal infiltration. Appendiceal diameter measures up to about 2.3 cm. Changes are consistent with acute appendicitis. No abscess. Small amount of free fluid in the pelvis is likely reactive. Prostate gland is not enlarged. Bladder wall is not thickened. No pelvic mass  or lymphadenopathy. No destructive bone lesions. IMPRESSION: Large epiglottic a left with appendiceal distention and periappendiceal stranding consistent with acute appendicitis. No abscess. Electronically Signed   By: WLucienne CapersM.D.   On: 09/07/2015 00:19      Assessment/Plan Acute appendicitis Leukocytosis - 13.0  Plan: 1.  Admit to [facility], urgent lap appy 2.  NPO, bowel rest, IVF, pain control, antiemetics, antibiotics (rocephin/flagyl) 3.  Discussed risks/benefits including bleeding, infection, injury to other structures, need for drain, conversion to open 4.  Discussed peri-operative, operative, and post-operative recovery and criteria for discharge.   MNat Christen PTri [facility] 09/07/2015, 7:12 AM Pager:  [phone] (7am - 4:30pm M-F; 7am - 11:30am Sa/Su)  

## 2015-09-07 NOTE — ED Notes (Signed)
Pt returned from CT °

## 2015-09-07 NOTE — Anesthesia Preprocedure Evaluation (Signed)
Anesthesia Evaluation  Patient identified by MRN, date of birth, ID band Patient awake    Reviewed: Allergy & Precautions, NPO status , Patient's Chart, lab work & pertinent test results  Airway Mallampati: II  TM Distance: >3 FB Neck ROM: Full    Dental no notable dental hx.    Pulmonary neg pulmonary ROS,    Pulmonary exam normal breath sounds clear to auscultation       Cardiovascular negative cardio ROS Normal cardiovascular exam Rhythm:Regular Rate:Normal     Neuro/Psych negative neurological ROS  negative psych ROS   GI/Hepatic negative GI ROS, Neg liver ROS,   Endo/Other  negative endocrine ROS  Renal/GU negative Renal ROS  negative genitourinary   Musculoskeletal negative musculoskeletal ROS (+)   Abdominal   Peds negative pediatric ROS (+)  Hematology negative hematology ROS (+)   Anesthesia Other Findings   Reproductive/Obstetrics negative OB ROS                             Anesthesia Physical Anesthesia Plan  ASA: I  Anesthesia Plan: General   Post-op Pain Management:    Induction: Intravenous  Airway Management Planned: Oral ETT  Additional Equipment:   Intra-op Plan:   Post-operative Plan: Extubation in OR  Informed Consent: I have reviewed the patients History and Physical, chart, labs and discussed the procedure including the risks, benefits and alternatives for the proposed anesthesia with the patient or authorized representative who has indicated his/her understanding and acceptance.   Dental advisory given  Plan Discussed with: CRNA  Anesthesia Plan Comments:         Anesthesia Quick Evaluation  

## 2015-09-08 MED ORDER — ALUM & MAG HYDROXIDE-SIMETH 200-200-20 MG/5ML PO SUSP
30.0000 mL | ORAL | Status: DC | PRN
  Administered 2015-09-08: 30 mL via ORAL
  Filled 2015-09-08: qty 30

## 2015-09-08 MED ORDER — HYDROCODONE-ACETAMINOPHEN 5-325 MG PO TABS: 1.0000 | ORAL_TABLET | ORAL | Status: DC | PRN

## 2015-09-08 NOTE — Discharge Instructions (Signed)
Laparoscopic Appendectomy, Adult, Care After °Refer to this sheet in the next few weeks. These instructions provide you with information on caring for yourself after your procedure. Your caregiver may also give you more specific instructions. Your treatment has been planned according to current medical practices, but problems sometimes occur. Call your caregiver if you have any problems or questions after your procedure. °HOME CARE INSTRUCTIONS °· Do not drive while taking narcotic pain medicines. °· Use stool softener if you become constipated from your pain medicines. °· Change your bandages (dressings) as directed. °· Keep your wounds clean and dry. You may wash the wounds gently with soap and water. Gently pat the wounds dry with a clean towel. °· Do not take baths, swim, or use hot tubs for 10 days, or as instructed by your caregiver. °· Only take over-the-counter or prescription medicines for pain, discomfort, or fever as directed by your caregiver. °· You may continue your normal diet as directed. °· Do not lift more than 10 pounds (4.5 kg) or play contact sports for 3 weeks, or as directed. °· Slowly increase your activity after surgery. °· Take deep breaths to avoid getting a lung infection (pneumonia). °SEEK MEDICAL CARE IF: °· You have redness, swelling, or increasing pain in your wounds. °· You have pus coming from your wounds. °· You have drainage from a wound that lasts longer than 1 day. °· You notice a bad smell coming from the wounds or dressing. °· Your wound edges break open after stitches (sutures) have been removed. °· You notice increasing pain in the shoulders (shoulder strap areas) or near your shoulder blades. °· You develop dizzy episodes or fainting while standing. °· You develop shortness of breath. °· You develop persistent nausea or vomiting. °· You cannot control your bowel functions or lose your appetite. °· You develop diarrhea. °SEEK IMMEDIATE MEDICAL CARE IF:  °· You have a  fever. °· You develop a rash. °· You have difficulty breathing or sharp pains in your chest. °· You develop any reaction or side effects to medicines given. °MAKE SURE YOU: °· Understand these instructions. °· Will watch your condition. °· Will get help right away if you are not doing well or get worse. °  °This information is not intended to replace advice given to you by your health care provider. Make sure you discuss any questions you have with your health care provider. °  °Document Released: 04/23/2005 Document Revised: 09/07/2014 Document Reviewed: 10/11/2014 °Elsevier Interactive Patient Education ©2016 Elsevier Inc. ° °

## 2015-09-08 NOTE — Discharge Summary (Signed)
  [facility] Discharge Summary   Patient ID: Joshua SpiresScott Underwood MRN: [id number] DOB/AGE: 07/05/1980 35 y.o.  Admit date: 09/06/2015 Discharge date: 09/08/2015  Admitting Diagnosis: Acute appendicitis  Discharge Diagnosis Patient Active Problem List   Diagnosis Date Noted  . Acute appendicitis 09/07/2015  . Appendicitis, acute 09/07/2015  . Bell's palsy 11/17/2013    Consultants None  Imaging: Ct Abdomen Pelvis W Contrast  09/07/2015  CLINICAL DATA:  Right lower quadrant pain for 2 days. Diarrhea. Patient is using Augmentin for sinusitis. EXAM: CT ABDOMEN AND PELVIS WITH CONTRAST TECHNIQUE: Multidetector CT imaging of the abdomen and pelvis was performed using the standard protocol following bolus administration of intravenous contrast. CONTRAST:  100mL ISOVUE-300 IOPAMIDOL (ISOVUE-300) INJECTION 61% COMPARISON:  None. FINDINGS: The lung bases are clear. Spleen is mildly enlarged. No focal lesions. The liver, gallbladder, pancreas, adrenal glands, kidneys, abdominal aorta, and retroperitoneal lymph nodes are unremarkable. Stomach, small bowel, and colon are not abnormally distended. No free air or free fluid in the abdomen. Pelvis: There is a large at appendicolith in the appendix with diffuse dilatation of the appendix and mild periappendiceal infiltration. Appendiceal diameter measures up to about 2.3 cm. Changes are consistent with acute appendicitis. No abscess. Small amount of free fluid in the pelvis is likely reactive. Prostate gland is not enlarged. Bladder wall is not thickened. No pelvic mass or lymphadenopathy. No destructive bone lesions. IMPRESSION: Large epiglottic a left with appendiceal distention and periappendiceal stranding consistent with acute appendicitis. No abscess. Electronically Signed   By: Burman NievesWilliam  Stevens M.D.   On: 09/07/2015 00:19    Procedures Dr. Derrell LollingIngram (09/07/15) - Laparoscopic Appendectomy  Hospital Course:  35 y/o healthy white male [facility] and [facility] c/o right RLQ abdominal pain since Monday and the pain worsened significantly on Tuesday along with anorexia. Pain is stabbing and non-radiating. Had been on Augmentin for 1 week because of sinusitis, but has missed several doses due to abdominal pain. No fever/chills, or vomiting. Does admit to loose stools and nausea. Finally came to the ED after pain became more severe in the RLQ. He notes over the last 2-3 hours the pain has significantly worsened since arrival.   CT scan shows large appendicolith and diffuse dilatation of the appendix. There is no abscess or evidence of rupture. It looks partially retrocecal. WBC 13,000. Other labs normal. Only previous surgery was WTE.   Patient was admitted and underwent procedure listed above.  Tolerated procedure well and was transferred to the floor.  Diet was advanced as tolerated.  On POD #1, the patient was voiding well, tolerating diet, ambulating well, pain well controlled, vital signs stable, incisions c/d/i and felt stable for discharge home.  Patient will follow up in our office in 3 weeks and knows to call with questions or concerns.        Medication List    STOP taking these medications        amoxicillin-clavulanate 875-125 MG tablet  Commonly known as:  AUGMENTIN      TAKE these medications        HYDROcodone-acetaminophen 5-325 MG tablet  Commonly known as:  NORCO/VICODIN  Take 1-2 tablets by mouth every 4 (four) hours as needed for moderate pain.           Signed: Nonie HoyerMegan N. Gearldine Looney, [facility] [phone]  09/08/2015, 8:25 AM

## 2015-09-08 NOTE — Progress Notes (Signed)
1 Day Post-Op  Subjective: Alert and stable.  Afebrile.  Feels much better.  Voiding without difficulty.  Ambulating in room.  Has Axid tolerated regular diet.  Asking if he can go home.  Objective: Vital signs in last 24 hours: Temp:  [97.6 F (36.4 C)-99.5 F (37.5 C)] 97.8 F (36.6 C) (05/04 0533) Pulse Rate:  [61-96] 64 (05/04 0533) Resp:  [10-19] 19 (05/04 0533) BP: (102-135)/(47-77) 108/61 mmHg (05/04 0533) SpO2:  [93 %-100 %] 94 % (05/04 0533) Last BM Date: 09/06/15  Intake/Output from previous day: 05/03 0701 - 05/04 0700 In: 3641.7 [I.V.:3641.7] Out: 2950 [Urine:2925; Blood:25] Intake/Output this shift: Total I/O In: 1500 [I.V.:1500] Out: 1425 [Urine:1425]  General appearance: Alert.  Appears comfortable.  Mental status normal Resp: clear to auscultation bilaterally GI: Abdomen soft and nondistended.  Wounds look fine.  Some bowel sounds present.  Slightly tender right lower quadrant but much improved.  Lab Results:  Results for orders placed or performed during the hospital encounter of 09/06/15 (from the past 24 hour(s))  CBC     Status: Abnormal   Collection Time: 09/07/15  3:59 PM  Result Value Ref Range   WBC 11.7 (H) 4.0 - 10.5 K/uL   RBC 4.20 (L) 4.22 - 5.81 MIL/uL   Hemoglobin 12.4 (L) 13.0 - 17.0 g/dL   HCT 16.135.8 (L) 09.639.0 - 04.552.0 %   MCV 85.2 78.0 - 100.0 fL   MCH 29.5 26.0 - 34.0 pg   MCHC 34.6 30.0 - 36.0 g/dL   RDW 40.912.1 81.111.5 - 91.415.5 %   Platelets 149 (L) 150 - 400 K/uL  Creatinine, serum     Status: None   Collection Time: 09/07/15  3:59 PM  Result Value Ref Range   Creatinine, Ser 1.03 0.61 - 1.24 mg/dL   GFR calc non Af Amer >60 >60 mL/min   GFR calc Af Amer >60 >60 mL/min     Studies/Results: No results found.  . cefTRIAXone (ROCEPHIN)  IV  2 g Intravenous Q24H   And  . metronidazole  500 mg Intravenous Q8H  . enoxaparin (LOVENOX) injection  40 mg Subcutaneous Q24H  . methocarbamol (ROBAXIN)  IV  1,000 mg Intravenous Q8H      Assessment/Plan: s/p Procedure(s): APPENDECTOMY LAPAROSCOPIC  POD #1.  Laparoscopic appendectomy for acute appendicitis without rupture Discharge after breakfast if does well We'll give prescription for Norco Does not need antibiotics Follow-up in office 2-3 weeks  @PROBHOSP @  LOS: 1 day    Ercel Normoyle M 09/08/2015

## 2015-09-08 NOTE — Progress Notes (Signed)
Patient alert and oriented with pain controlled. Patient given discharge instructions and prescriptions. Patient verbalized understanding of home care and pain control. All questions and concerns answered.

## 2015-09-21 DIAGNOSIS — K3580 Unspecified acute appendicitis: Secondary | ICD-10-CM | POA: Diagnosis not present

## 2016-03-12 DIAGNOSIS — G510 Bell's palsy: Secondary | ICD-10-CM | POA: Diagnosis not present

## 2016-03-12 DIAGNOSIS — F988 Other specified behavioral and emotional disorders with onset usually occurring in childhood and adolescence: Secondary | ICD-10-CM | POA: Diagnosis not present

## 2016-03-22 ENCOUNTER — Ambulatory Visit (INDEPENDENT_AMBULATORY_CARE_PROVIDER_SITE_OTHER): Payer: BLUE CROSS/BLUE SHIELD | Admitting: Neurology

## 2016-03-22 ENCOUNTER — Encounter: Payer: Self-pay | Admitting: Neurology

## 2016-03-22 VITALS — BP 120/60 | HR 80 | Ht 72.0 in | Wt 188.3 lb

## 2016-03-22 DIAGNOSIS — G510 Bell's palsy: Secondary | ICD-10-CM | POA: Diagnosis not present

## 2016-03-22 NOTE — Progress Notes (Signed)
NEUROLOGY CONSULTATION NOTE  Joshua SpiresScott Gassman MRN: [id number] DOB: 06-May-1981  Referring provider: Dr. Susa SimmondsVia Primary care provider: Dr. Susa SimmondsVia  Reason for consult:  Bell's palsy  HISTORY OF PRESENT ILLNESS: Joshua Underwood is a 35 year old right-handed man who presents for right-sided Bell's palsy.  History obtained by patient and supplemented by ED, PCP and prior neurology notes.  He presented to the ED on 11/06/13 with 2 days of right posterior auricular pain and right upper and lower facial weakness.  He was given a 2 day course of prednisone and Valtrex but the right facial weakness got worse.  He reported metallic taste and hyperacusis on the right but no double vision.  He followed up with neurology on 11/17/13, where he received a longer course of steroids.  It took about 18 months before he had significant recovery from the facial weakness.  However, he continues to exhibit synkinesis.  He notes twitching around his right eye and along the right nasolabial fold.  He also notes spasm on the right side of his chin.  The right side of his upper lip feels drawn up and the right side of his face feels tight.  It is not necessarily painful but it is uncomfortable.  He typically chews gum throughout the day in an attempt to minimize the tightness in his face.  Labs from May reveal unremarkable CMP.  PAST MEDICAL HISTORY: Past Medical History:  Diagnosis Date  . Bell's palsy 11/17/2013   right    PAST SURGICAL HISTORY: Past Surgical History:  Procedure Laterality Date  . LAPAROSCOPIC APPENDECTOMY N/A 09/07/2015   Procedure: APPENDECTOMY LAPAROSCOPIC;  Surgeon: Claud KelpHaywood Ingram, MD;  Location: [facility];  Service: General;  Laterality: N/A;  . WISDOM TOOTH EXTRACTION      MEDICATIONS: No current outpatient prescriptions on file prior to visit.   No current facility-administered medications on file prior to visit.     ALLERGIES: No Known Allergies  FAMILY HISTORY: Family History  Problem  Relation Age of Onset  . Diabetes Maternal Grandmother   . Kidney disease Maternal Grandfather   . Diabetes Paternal Grandmother     SOCIAL HISTORY: Social History   Social History  . Marital status: Married    Spouse name: N/A  . Number of children: 0  . Years of education: college   Occupational History  . Journalist     [employer]   Social History Main Topics  . Smoking status: Never Smoker  . Smokeless tobacco: Never Used  . Alcohol use No  . Drug use: No  . Sexual activity: Yes    Birth control/ protection: None   Other Topics Concern  . Not on file   Social History Narrative   Patient is married, one child.   Patient is right handed.   Patient has college education.   Patient drinks 2 sodas a week.   Lives in a 2 story home.      REVIEW OF SYSTEMS: Constitutional: No fevers, chills, or sweats, no generalized fatigue, change in appetite Eyes: No visual changes, double vision, eye pain Ear, nose and throat: No hearing loss, ear pain, nasal congestion, sore throat Cardiovascular: No chest pain, palpitations Respiratory:  No shortness of breath at rest or with exertion, wheezes GastrointestinaI: No nausea, vomiting, diarrhea, abdominal pain, fecal incontinence Genitourinary:  No dysuria, urinary retention or frequency Musculoskeletal:  No neck pain, back pain Integumentary: No rash, pruritus, skin lesions Neurological: as above Psychiatric: No depression, insomnia, anxiety Endocrine: No palpitations,  fatigue, diaphoresis, mood swings, change in appetite, change in weight, increased thirst Hematologic/Lymphatic:  No purpura, petechiae. Allergic/Immunologic: no itchy/runny eyes, nasal congestion, recent allergic reactions, rashes  PHYSICAL EXAM: Vitals:   03/22/16 1313  BP: 120/60  Pulse: 80   General: No acute distress.  Patient appears well-groomed.  Head:  Normocephalic/atraumatic Eyes:  fundi examined but not visualized Neck: supple, no  paraspinal tenderness, full range of motion Back: No paraspinal tenderness Heart: regular rate and rhythm Lungs: Clear to auscultation bilaterally. Vascular: No carotid bruits. Neurological Exam: Mental status: alert and oriented to person, place, and time, recent and remote memory intact, fund of knowledge intact, attention and concentration intact, speech fluent and not dysarthric, language intact. Cranial nerves: CN I: not tested CN II: pupils equal, round and reactive to light, visual fields intact CN III, IV, VI:  full range of motion, no nystagmus, no ptosis CN V: facial sensation intact CN VII: Mildly decreased right upper and lower facial weakness. CN VIII: hearing intact CN IX, X: gag intact, uvula midline CN XI: sternocleidomastoid and trapezius muscles intact CN XII: tongue midline Bulk & Tone: normal, no fasciculations. Motor:  5/5 throughout Sensation: temperature and vibration sensation intact. Deep Tendon Reflexes:  2+ throughout, toes downgoing.  Finger to nose testing:  Without dysmetria.  Heel to shin:  Without dysmetria.  Gait:  Normal station and stride.  Able to turn and tandem walk. Romberg negative.  IMPRESSION: Right sided Bell's palsy with synkinesis.  He inquires about Botox.  Botox would be effective for treating frequent twitching associated with synkinesis.  On exam, he does not exhibit any noticeable twitching. Botox would not be beneficial for the "tight" and pulling sensation of his facial muscles, nor would it improve facial symmetry.  Therefore, I do not think Botox would be an appropriate option.  I am not familiar with other therapies.  There is a surgical procedure, which I feel would not be appropriate in his case.  Unfortunately, this is just the residual effects of Bell's palsy.     Thank you for allowing me to take part in the care of this patient.  Shon MilletAdam Jaffe, DO  CC:  Iva BoopKevin Via, MD

## 2016-03-22 NOTE — Patient Instructions (Signed)
I will get back to you regarding if Botox may be a good option.  Expect to hear from us today or tomorrow.

## 2016-04-05 DIAGNOSIS — F902 Attention-deficit hyperactivity disorder, combined type: Secondary | ICD-10-CM | POA: Diagnosis not present

## 2016-04-24 DIAGNOSIS — F902 Attention-deficit hyperactivity disorder, combined type: Secondary | ICD-10-CM | POA: Diagnosis not present

## 2016-05-10 DIAGNOSIS — F988 Other specified behavioral and emotional disorders with onset usually occurring in childhood and adolescence: Secondary | ICD-10-CM | POA: Diagnosis not present

## 2016-05-10 DIAGNOSIS — J3489 Other specified disorders of nose and nasal sinuses: Secondary | ICD-10-CM | POA: Diagnosis not present

## 2016-05-22 ENCOUNTER — Ambulatory Visit: Payer: BLUE CROSS/BLUE SHIELD | Admitting: Neurology

## 2016-08-08 IMAGING — CT CT ABD-PELV W/ CM
2 of 4 series · 16 of 46 positions shown, 18 images · IV contrast (APPLIED)
Comparison: None.

CLINICAL DATA: Right lower quadrant pain for 2 days. Diarrhea.
Patient is using Augmentin for sinusitis.

EXAM:
CT ABDOMEN AND PELVIS WITH CONTRAST
TECHNIQUE: Multidetector CT imaging of the abdomen and pelvis was performed
using the standard protocol following bolus administration of
intravenous contrast.
CONTRAST:  100mL LM79BC-TRR IOPAMIDOL (LM79BC-TRR) INJECTION 61%

[Series 2: axial st · axial · 0.77mm/px · z∈[-535,-35]mm · 13 of 110 slices shown, 15 images]
[im 5/110  soft-tissue]
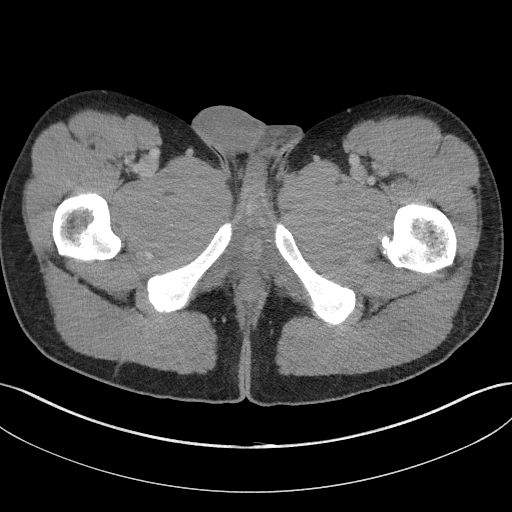
[im 5/110  bone]
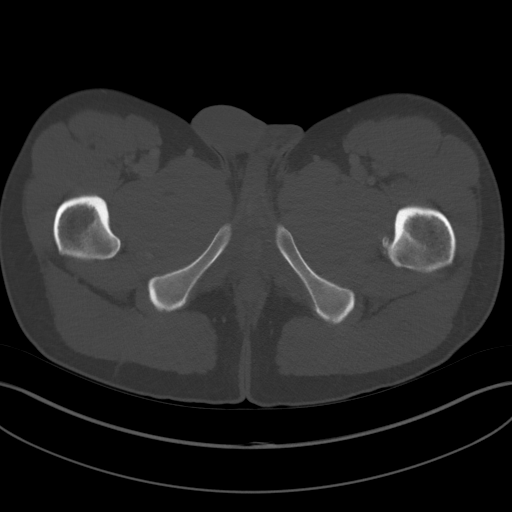
[im 14/110  soft-tissue]
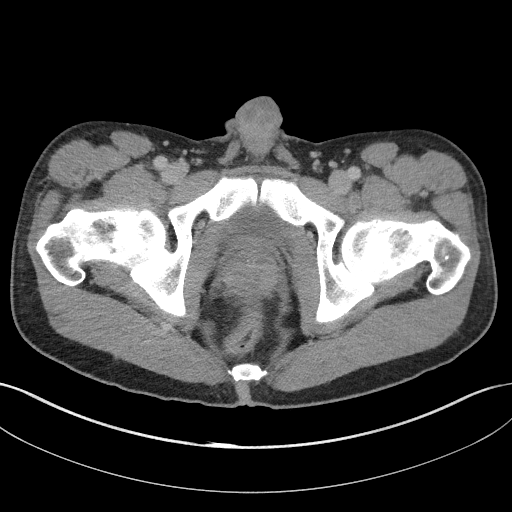
[im 23/110  soft-tissue]
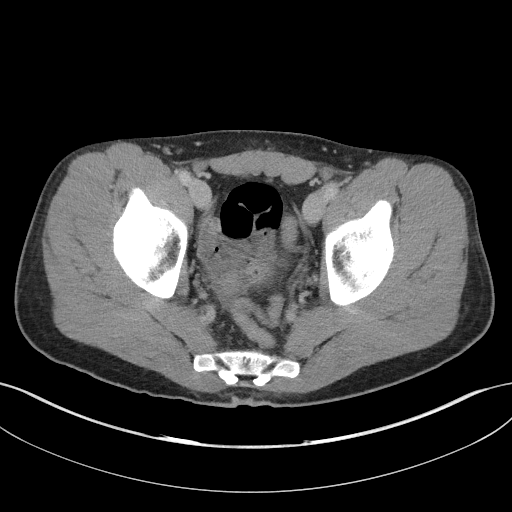
[im 32/110  soft-tissue]
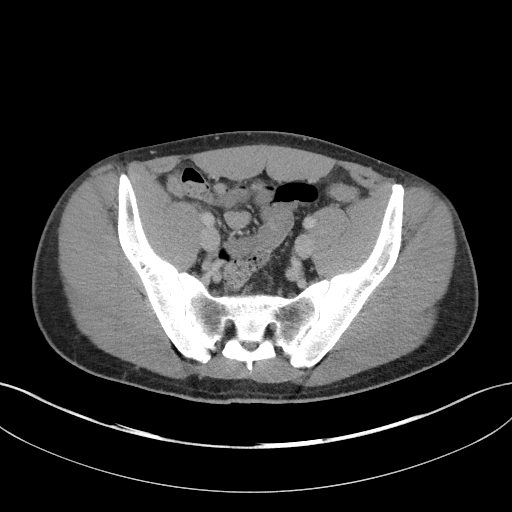
[im 37/110  soft-tissue]
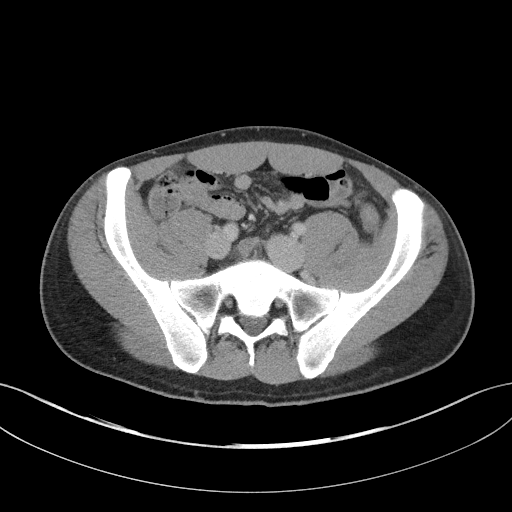
[im 46/110  soft-tissue]
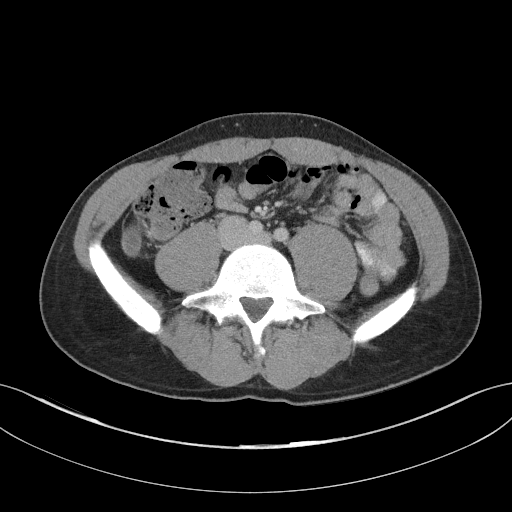
[im 55/110  soft-tissue]
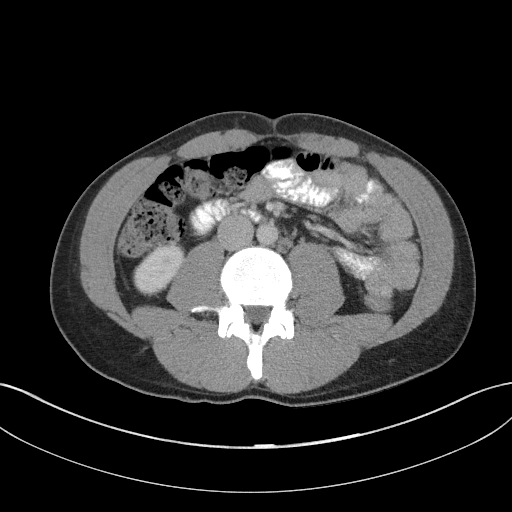
[im 64/110  soft-tissue]
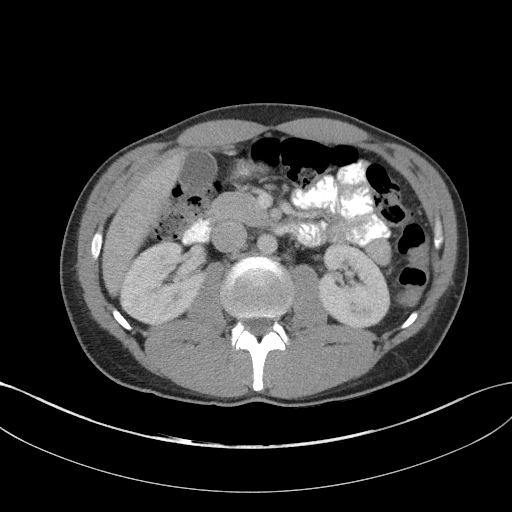
[im 73/110  soft-tissue]
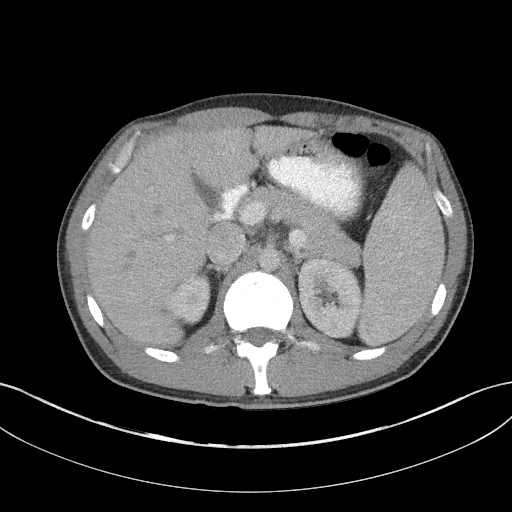
[im 73/110  bone]
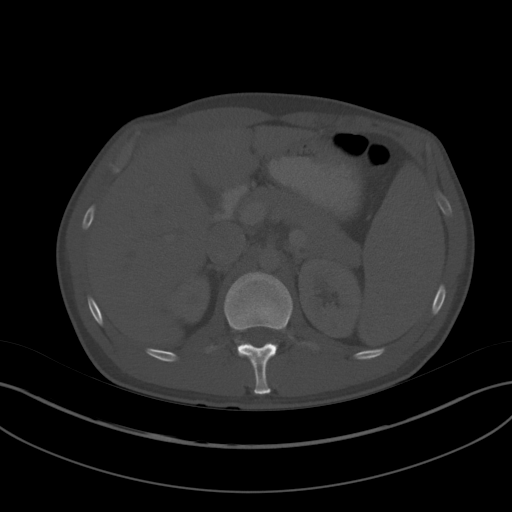
[im 78/110  soft-tissue]
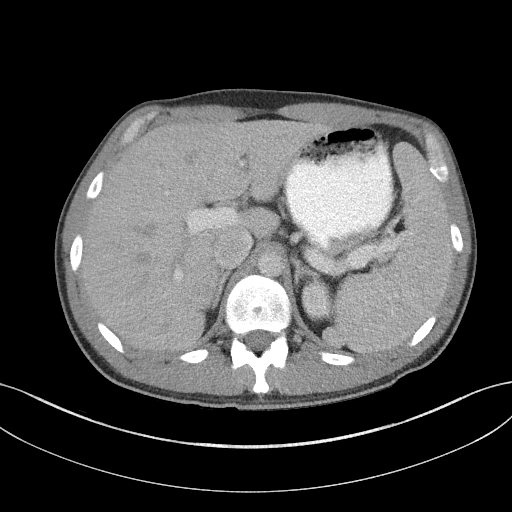
[im 87/110  soft-tissue]
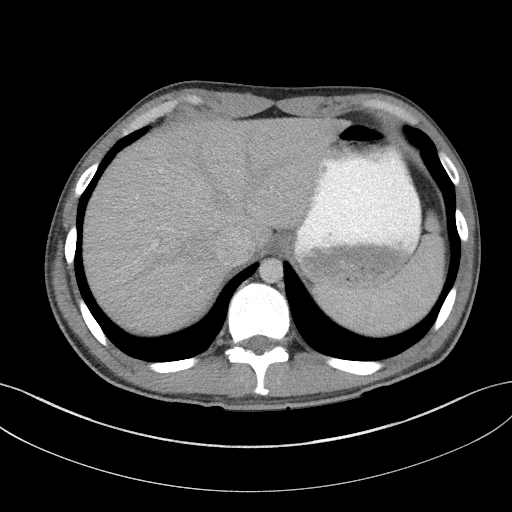
[im 96/110  soft-tissue]
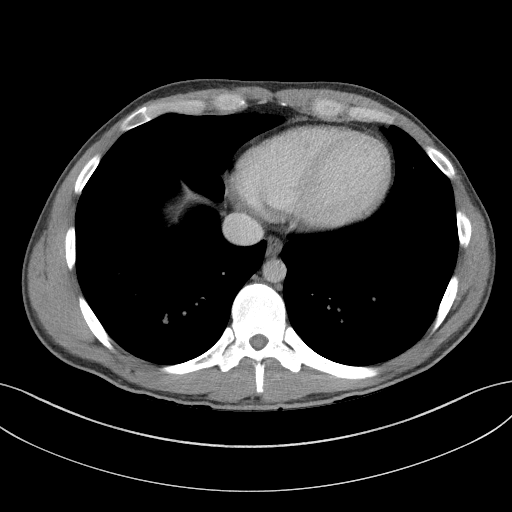
[im 105/110  soft-tissue]
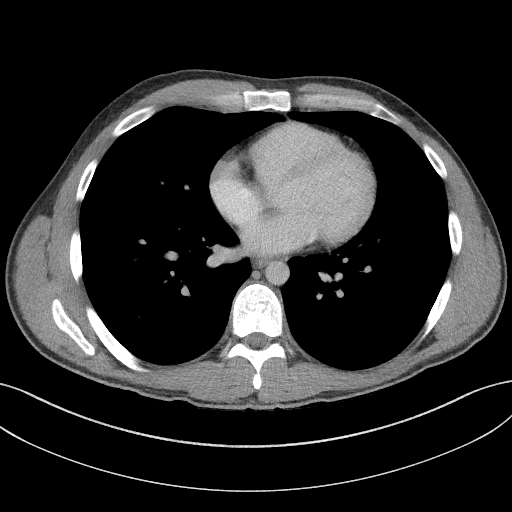

[Series 5: coronal st · coronal · 0.81mm/px · 3 of 80 slices shown]
[im 27/80  soft-tissue]
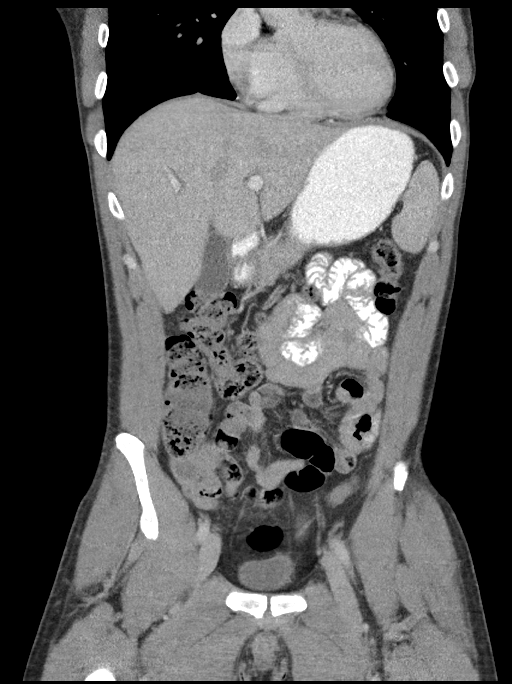
[im 36/80  soft-tissue]
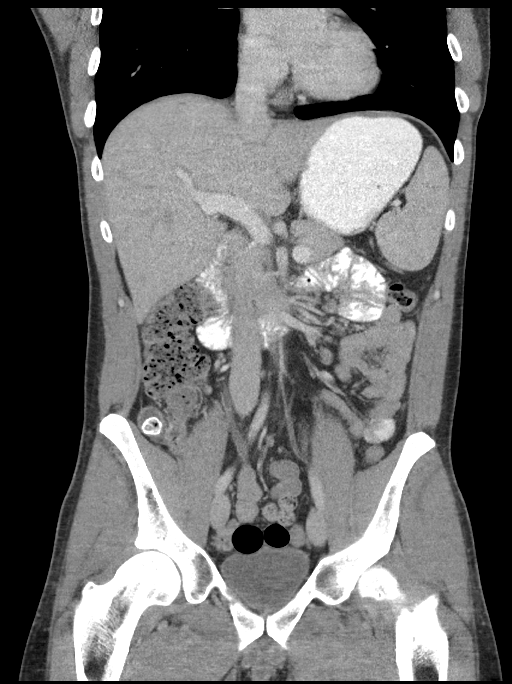
[im 44/80  soft-tissue]
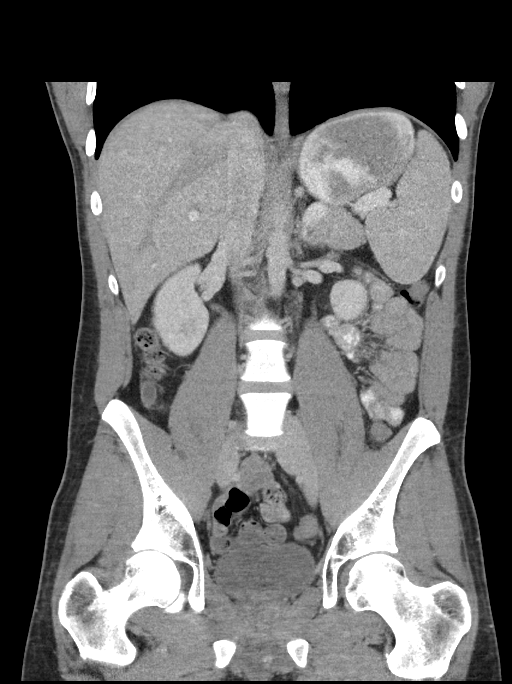

[16 of 46 positions shown; findings below may reference images not displayed]

FINDINGS: The lung bases are clear.

Spleen is mildly enlarged. No focal lesions. The liver, gallbladder,
pancreas, adrenal glands, kidneys, abdominal aorta, and
retroperitoneal lymph nodes are unremarkable. Stomach, small bowel,
and colon are not abnormally distended. No free air or free fluid in
the abdomen.

Pelvis: There is a large at appendicolith in the appendix with
diffuse dilatation of the appendix and mild periappendiceal
infiltration. Appendiceal diameter measures up to about 2.3 cm.
Changes are consistent with acute appendicitis. No abscess.

Small amount of free fluid in the pelvis is likely reactive.
Prostate gland is not enlarged. Bladder wall is not thickened. No
pelvic mass or lymphadenopathy. No destructive bone lesions.
IMPRESSION: Large epiglottic a left with appendiceal distention and
periappendiceal stranding consistent with acute appendicitis. No
abscess.

## 2016-10-22 DIAGNOSIS — J329 Chronic sinusitis, unspecified: Secondary | ICD-10-CM | POA: Diagnosis not present

## 2016-10-30 DIAGNOSIS — R439 Unspecified disturbances of smell and taste: Secondary | ICD-10-CM | POA: Diagnosis not present

## 2017-01-02 DIAGNOSIS — Z3009 Encounter for other general counseling and advice on contraception: Secondary | ICD-10-CM | POA: Diagnosis not present

## 2017-01-02 DIAGNOSIS — F988 Other specified behavioral and emotional disorders with onset usually occurring in childhood and adolescence: Secondary | ICD-10-CM | POA: Diagnosis not present

## 2017-02-19 DIAGNOSIS — Z3009 Encounter for other general counseling and advice on contraception: Secondary | ICD-10-CM | POA: Diagnosis not present

## 2017-03-15 DIAGNOSIS — Z302 Encounter for sterilization: Secondary | ICD-10-CM | POA: Diagnosis not present

## 2017-10-22 DIAGNOSIS — F988 Other specified behavioral and emotional disorders with onset usually occurring in childhood and adolescence: Secondary | ICD-10-CM | POA: Diagnosis not present

## 2018-04-14 DIAGNOSIS — R439 Unspecified disturbances of smell and taste: Secondary | ICD-10-CM | POA: Diagnosis not present

## 2018-05-28 DIAGNOSIS — L649 Androgenic alopecia, unspecified: Secondary | ICD-10-CM | POA: Diagnosis not present
